# Patient Record
Sex: Female | Born: 1955
Health system: Southern US, Community
[De-identification: ages and names within clinical notes are randomized; demographics above are authoritative.]

## PROBLEM LIST (undated history)

## (undated) DIAGNOSIS — I10 Essential (primary) hypertension: Secondary | ICD-10-CM

## (undated) DIAGNOSIS — E669 Obesity, unspecified: Secondary | ICD-10-CM

## (undated) DIAGNOSIS — R002 Palpitations: Secondary | ICD-10-CM

## (undated) DIAGNOSIS — Z9889 Other specified postprocedural states: Secondary | ICD-10-CM

## (undated) DIAGNOSIS — F32A Depression, unspecified: Secondary | ICD-10-CM

## (undated) DIAGNOSIS — M1990 Unspecified osteoarthritis, unspecified site: Secondary | ICD-10-CM

## (undated) DIAGNOSIS — I499 Cardiac arrhythmia, unspecified: Secondary | ICD-10-CM

## (undated) DIAGNOSIS — G5761 Lesion of plantar nerve, right lower limb: Secondary | ICD-10-CM

## (undated) DIAGNOSIS — K5730 Diverticulosis of large intestine without perforation or abscess without bleeding: Secondary | ICD-10-CM

## (undated) DIAGNOSIS — K219 Gastro-esophageal reflux disease without esophagitis: Secondary | ICD-10-CM

## (undated) DIAGNOSIS — F3289 Other specified depressive episodes: Secondary | ICD-10-CM

## (undated) DIAGNOSIS — F329 Major depressive disorder, single episode, unspecified: Secondary | ICD-10-CM

## (undated) DIAGNOSIS — R9389 Abnormal findings on diagnostic imaging of other specified body structures: Secondary | ICD-10-CM

## (undated) DIAGNOSIS — D1399 Benign neoplasm of ill-defined sites within the digestive system: Secondary | ICD-10-CM

## (undated) DIAGNOSIS — K222 Esophageal obstruction: Secondary | ICD-10-CM

## (undated) DIAGNOSIS — D649 Anemia, unspecified: Secondary | ICD-10-CM

## (undated) DIAGNOSIS — N920 Excessive and frequent menstruation with regular cycle: Secondary | ICD-10-CM

## (undated) DIAGNOSIS — D139 Benign neoplasm of ill-defined sites within the digestive system: Secondary | ICD-10-CM

## (undated) DIAGNOSIS — K8020 Calculus of gallbladder without cholecystitis without obstruction: Secondary | ICD-10-CM

## (undated) HISTORY — DX: Diverticulosis of large intestine without perforation or abscess without bleeding: K57.30

## (undated) HISTORY — DX: Essential (primary) hypertension: I10

## (undated) HISTORY — DX: Esophageal obstruction: K22.2

## (undated) HISTORY — PX: OTHER SURGICAL HISTORY: SHX169

## (undated) HISTORY — DX: Other specified depressive episodes: F32.89

## (undated) HISTORY — PX: COLONOSCOPY: SHX174

## (undated) HISTORY — PX: KNEE SURGERY: SHX244

## (undated) HISTORY — DX: Calculus of gallbladder without cholecystitis without obstruction: K80.20

## (undated) HISTORY — DX: Major depressive disorder, single episode, unspecified: F32.9

## (undated) HISTORY — DX: Gastro-esophageal reflux disease without esophagitis: K21.9

## (undated) HISTORY — DX: Benign neoplasm of ill-defined sites within the digestive system: D13.9

## (undated) HISTORY — DX: Benign neoplasm of ill-defined sites within the digestive system: D13.99

## (undated) HISTORY — DX: Depression, unspecified: F32.A

## (undated) HISTORY — DX: Palpitations: R00.2

## (undated) HISTORY — DX: Anemia, unspecified: D64.9

## (undated) HISTORY — DX: Obesity, unspecified: E66.9

## (undated) HISTORY — PX: UPPER GASTROINTESTINAL ENDOSCOPY: SHX188

## (undated) HISTORY — DX: Unspecified osteoarthritis, unspecified site: M19.90

---
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
## 1998-11-03 ENCOUNTER — Other Ambulatory Visit: Admission: RE | Admit: 1998-11-03 | Discharge: 1998-11-03 | Payer: Self-pay | Admitting: Obstetrics and Gynecology

## 1998-11-11 ENCOUNTER — Ambulatory Visit (HOSPITAL_COMMUNITY): Admission: RE | Admit: 1998-11-11 | Discharge: 1998-11-11 | Payer: Self-pay | Admitting: Obstetrics and Gynecology

## 1998-11-11 ENCOUNTER — Encounter: Payer: Self-pay | Admitting: Obstetrics and Gynecology

## 1998-11-16 ENCOUNTER — Ambulatory Visit (HOSPITAL_COMMUNITY): Admission: RE | Admit: 1998-11-16 | Discharge: 1998-11-16 | Payer: Self-pay | Admitting: Obstetrics and Gynecology

## 1998-11-16 ENCOUNTER — Encounter: Payer: Self-pay | Admitting: Obstetrics and Gynecology

## 1999-11-12 ENCOUNTER — Other Ambulatory Visit: Admission: RE | Admit: 1999-11-12 | Discharge: 1999-11-12 | Payer: Self-pay | Admitting: *Deleted

## 1999-11-26 ENCOUNTER — Encounter: Payer: Self-pay | Admitting: Obstetrics and Gynecology

## 1999-11-26 ENCOUNTER — Ambulatory Visit (HOSPITAL_COMMUNITY): Admission: RE | Admit: 1999-11-26 | Discharge: 1999-11-26 | Payer: Self-pay | Admitting: *Deleted

## 2000-11-15 ENCOUNTER — Other Ambulatory Visit: Admission: RE | Admit: 2000-11-15 | Discharge: 2000-11-15 | Payer: Self-pay | Admitting: Obstetrics and Gynecology

## 2000-11-29 ENCOUNTER — Encounter: Payer: Self-pay | Admitting: Obstetrics and Gynecology

## 2000-11-29 ENCOUNTER — Ambulatory Visit (HOSPITAL_COMMUNITY): Admission: RE | Admit: 2000-11-29 | Discharge: 2000-11-29 | Payer: Self-pay | Admitting: Obstetrics and Gynecology

## 2002-01-01 ENCOUNTER — Ambulatory Visit (HOSPITAL_COMMUNITY): Admission: RE | Admit: 2002-01-01 | Discharge: 2002-01-01 | Payer: Self-pay | Admitting: Obstetrics and Gynecology

## 2002-01-01 ENCOUNTER — Encounter: Payer: Self-pay | Admitting: Obstetrics and Gynecology

## 2002-01-23 ENCOUNTER — Other Ambulatory Visit: Admission: RE | Admit: 2002-01-23 | Discharge: 2002-01-23 | Payer: Self-pay | Admitting: Obstetrics and Gynecology

## 2003-02-04 ENCOUNTER — Ambulatory Visit (HOSPITAL_COMMUNITY): Admission: RE | Admit: 2003-02-04 | Discharge: 2003-02-04 | Payer: Self-pay | Admitting: Orthopedic Surgery

## 2003-02-19 ENCOUNTER — Ambulatory Visit (HOSPITAL_COMMUNITY): Admission: RE | Admit: 2003-02-19 | Discharge: 2003-02-19 | Payer: Self-pay | Admitting: Obstetrics and Gynecology

## 2003-02-26 ENCOUNTER — Other Ambulatory Visit: Admission: RE | Admit: 2003-02-26 | Discharge: 2003-02-26 | Payer: Self-pay | Admitting: Obstetrics and Gynecology

## 2004-03-19 ENCOUNTER — Ambulatory Visit (HOSPITAL_COMMUNITY): Admission: RE | Admit: 2004-03-19 | Discharge: 2004-03-19 | Payer: Self-pay | Admitting: Obstetrics and Gynecology

## 2005-03-23 ENCOUNTER — Ambulatory Visit (HOSPITAL_COMMUNITY): Admission: RE | Admit: 2005-03-23 | Discharge: 2005-03-23 | Payer: Self-pay | Admitting: Obstetrics and Gynecology

## 2006-04-05 ENCOUNTER — Ambulatory Visit (HOSPITAL_COMMUNITY): Admission: RE | Admit: 2006-04-05 | Discharge: 2006-04-05 | Payer: Self-pay | Admitting: Obstetrics and Gynecology

## 2006-11-04 ENCOUNTER — Emergency Department (HOSPITAL_COMMUNITY): Admission: EM | Admit: 2006-11-04 | Discharge: 2006-11-04 | Payer: Self-pay | Admitting: Emergency Medicine

## 2006-11-13 ENCOUNTER — Ambulatory Visit: Payer: Self-pay | Admitting: Gastroenterology

## 2006-12-05 ENCOUNTER — Encounter: Payer: Self-pay | Admitting: Gastroenterology

## 2006-12-05 ENCOUNTER — Ambulatory Visit: Payer: Self-pay | Admitting: Gastroenterology

## 2006-12-05 DIAGNOSIS — K222 Esophageal obstruction: Secondary | ICD-10-CM | POA: Insufficient documentation

## 2006-12-05 DIAGNOSIS — D139 Benign neoplasm of ill-defined sites within the digestive system: Secondary | ICD-10-CM | POA: Insufficient documentation

## 2006-12-28 ENCOUNTER — Ambulatory Visit: Payer: Self-pay | Admitting: Gastroenterology

## 2006-12-28 DIAGNOSIS — K5730 Diverticulosis of large intestine without perforation or abscess without bleeding: Secondary | ICD-10-CM | POA: Insufficient documentation

## 2007-04-24 DIAGNOSIS — F329 Major depressive disorder, single episode, unspecified: Secondary | ICD-10-CM | POA: Insufficient documentation

## 2007-09-28 ENCOUNTER — Ambulatory Visit (HOSPITAL_COMMUNITY): Admission: RE | Admit: 2007-09-28 | Discharge: 2007-09-28 | Payer: Self-pay | Admitting: Obstetrics and Gynecology

## 2009-10-28 ENCOUNTER — Ambulatory Visit (HOSPITAL_COMMUNITY): Admission: RE | Admit: 2009-10-28 | Discharge: 2009-10-28 | Payer: Self-pay | Admitting: Obstetrics and Gynecology

## 2009-11-02 ENCOUNTER — Encounter: Admission: RE | Admit: 2009-11-02 | Discharge: 2009-11-02 | Payer: Self-pay | Admitting: Obstetrics and Gynecology

## 2010-03-07 ENCOUNTER — Encounter: Payer: Self-pay | Admitting: Obstetrics and Gynecology

## 2010-06-29 NOTE — Assessment & Plan Note (Signed)
[facility]                         GASTROENTEROLOGY OFFICE NOTE   NAME:Victoria Beasley, Victoria Beasley                     MRN:          [id number]  DATE:11/13/2006                            DOB:          [birth year]    REASON FOR CONSULTATION:  1. Colorectal cancer screening.  2. Dysphagia.   [name] is a pleasant 55 year old white female here for evaluation  of above. On occasion she has seen bright red blood on the toilet paper.  She has had a history of hemorrhoids. She denies change in bowel habits.  Her main GI complaint is an episode of a pill getting stuck in the back  of her throat. She was seen in the ER for this. It passed spontaneously.  No foreign body was seen by the ER physician. Ms. Salts does report  mild dysphagia to solids. She has longstanding pyrosis which is well-  controlled with omeprazole. She denies odynophagia.   PAST MEDICAL HISTORY:  Pertinent for depression.   PAST SURGICAL HISTORY:  She is status post C-section x2 and arthroscopic  surgery for her knee.   FAMILY HISTORY:  Pertinent for a brother with heart disease and a sister  and mother with diabetes.   CURRENT MEDICATIONS:  1. Omeprazole.  2. Wellbutrin.  3. Celebrex.   ALLERGIES:  No known drug allergies.   SOCIAL HISTORY:  She does not smoke. She drinks rarely. 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   REVIEW OF SYSTEMS:  Is positive for some leakage of urine.   PHYSICAL EXAMINATION:  Pulse 76. Blood pressure 126/82, weight 243.  HEENT: EOMI.  PERRLA.  Sclerae are anicteric.  Conjunctivae are pink.  NECK:  Supple without thyromegaly, adenopathy or carotid bruits.  CHEST:  Clear to auscultation and percussion without adventitious  sounds.  CARDIAC:  Regular rhythm; normal S1 S2.  There are no murmurs, gallops  or rubs.  ABDOMEN:  Bowel sounds are normoactive.  Abdomen is soft, nontender and  nondistended.  There are no abdominal masses,  tenderness, splenic  enlargement or hepatomegaly.  EXTREMITIES:  Full range of motion.  No cyanosis, clubbing or edema.  RECTAL:  Deferred.   IMPRESSION:  1. Limited rectal bleeding, most likely secondary to hemorrhoids.  2. Dysphagia, rule out early esophageal stricture.  3. Gastroesophageal reflux disease.   RECOMMENDATION:  1. Screening colonoscopy.  2. Upper endoscopy with dilation as indicated.  3. Continue omeprazole.     Barbette Hair. Arlyce Dice, MD,FACG  Electronically Signed    RDK/MedQ  DD: 11/13/2006  DT: 11/13/2006  Job #: [id number]   cc:   Gretta Arab. Valentina Lucks, M.D.

## 2010-06-29 NOTE — Letter (Signed)
November 13, 2006    Nolene Bernheim   RE:  CHARLINE, HOSKINSON  MRN:  [id number]  /  DOB:  [birth year]   Dear Ms. Math:   It is my pleasure to have treated you recently as a new patient in my  office.  I appreciate your confidence and the opportunity to participate  in your care.   Since I do have a busy inpatient endoscopy schedule and office schedule,  my office hours vary weekly.  I am, however, available for emergency  calls every day through my office.  If I cannot promptly meet an urgent  office appointment, another one of our gastroenterologists will be able  to assist you.   My well-trained staff are prepared to help you at all times.  For  emergencies after office hours, a physician from our gastroenterology  section is always available through my 24-hour answering service.   While you are under my care, I encourage discussion of your questions  and concerns, and I will be happy to return your calls as soon as I am  available.   Once again, I welcome you as a new patient and I look forward to a happy  and healthy relationship.    Sincerely,      Barbette Hair. Arlyce Dice, MD,FACG  Electronically Signed   RDK/MedQ  DD: 11/13/2006  DT: 11/13/2006  Job #: [id number]

## 2010-07-22 ENCOUNTER — Encounter: Payer: Self-pay | Admitting: Gastroenterology

## 2010-07-22 ENCOUNTER — Ambulatory Visit (INDEPENDENT_AMBULATORY_CARE_PROVIDER_SITE_OTHER): Payer: 59 | Admitting: Gastroenterology

## 2010-07-22 DIAGNOSIS — K8020 Calculus of gallbladder without cholecystitis without obstruction: Secondary | ICD-10-CM | POA: Insufficient documentation

## 2010-07-22 DIAGNOSIS — K219 Gastro-esophageal reflux disease without esophagitis: Secondary | ICD-10-CM

## 2010-07-22 MED ORDER — DEXLANSOPRAZOLE 60 MG PO CPDR: DELAYED_RELEASE_CAPSULE | ORAL | Status: DC

## 2010-07-22 NOTE — Assessment & Plan Note (Signed)
She has had persistent symptoms despite taking Nexium.  Recommendations #1 trial of dexilant to 60 mg twice a day #2 if not improved in the next 7-10 days I will repeat her upper endoscopy

## 2010-07-22 NOTE — Patient Instructions (Signed)
Dexilant samples Follow up in 6 weeks Call back in 1 week if no better  Acid Reflux (GERD) Acid reflux is also called gastroesophageal reflux disease (GERD). Your stomach makes acid to help digest food. Acid reflux happens when acid from your stomach goes into the tube between your mouth and stomach (esophagus). Your stomach is protected from the acid, but this tube is not. When acid gets into the tube, it may cause a burning feeling in the chest (heartburn). Besides heartburn, other health problems can happen if the acid keeps going into the tube. Some causes of acid reflux include:  Being overweight.   Smoking.   Drinking alcohol.   Eating large meals.   Eating meals and then going to bed right away.   Eating certain foods.   Increased stomach acid production.  HOME CARE  Take all medicine as told by your doctor.   You may need to:   Lose weight.   Avoid alcohol.   Quit smoking.   Do not eat big meals. It is better to eat smaller meals throughout the day.   Do not eat a meal and then nap or go to bed.   Sleep with your head higher than your stomach.   Avoid foods that bother you.   You may need more tests, or you may need to see a special doctor.  GET HELP RIGHT AWAY IF:  You have chest pain that is different than before.   You have pain that goes to your arms, jaw, or between your shoulder blades.   You throw up (vomit) blood, dark brown liquid, or your throw up looks like coffee grounds.   You have trouble swallowing.   You have trouble breathing or cannot stop coughing.   You feel dizzy or pass out.   Your skin is cool, wet, and pale.   Your medicine is not helping.  MAKE SURE YOU:   Understand these instructions.   Will watch your condition.   Will get help right away if you are not doing well or get worse.  Document Released: 07/20/2007 Document Re-Released: 04/27/2009 [facility] Patient Information 2011 [place], [place].

## 2010-07-22 NOTE — Progress Notes (Signed)
History of Present Illness:  Victoria Beasley is a pleasant 55 year old white female with history of esophageal stricture and diverticulosis referred at the request of Dr. Valentina Lucks for evaluation of chest discomfort. Endoscopy in 2008 demonstrated a distal esophageal stricture which was dilated. Benign gastric polyps were seen. Colonoscopy in November, 2008 showed diverticulosis.  Following a viral infection characterized by fever and muscle aches in April, 2012 she  developed severe burning chest discomfort. This may occur throughout the day and at night despite taking Nexium. She has had occasional odynophagia but denies dysphagia. She takes Celebrex daily which has been her routine for years.  Abdominal ultrasound in April, 2012 demonstrated gallstones. No abnormalities of the gallbladder wall were noted.   Review of Systems: Pertinent positive and negative review of systems were noted in the above HPI section. All other review of systems were otherwise negative.    Current Medications, Allergies, Past Medical History, Past Surgical History, Family History and Social History were reviewed in [facility] electronic medical record  Vital signs were reviewed in today's medical record. Physical Exam: General: Well developed , well nourished, no acute distress Head: Normocephalic and atraumatic Eyes:  sclerae anicteric, EOMI Ears: Normal auditory acuity Mouth: No deformity or lesions Lungs: Clear throughout to auscultation Heart: Regular rate and rhythm; no murmurs, rubs or bruits Abdomen: Soft, non tender and non distended. No masses, hepatosplenomegaly or hernias noted. Normal Bowel sounds Rectal:deferred Musculoskeletal: Symmetrical with no gross deformities  Pulses:  Normal pulses noted Extremities: No clubbing, cyanosis, edema or deformities noted Neurological: Alert oriented x 4, grossly nonfocal Psychological:  Alert and cooperative. Normal mood and affect

## 2010-07-22 NOTE — Assessment & Plan Note (Signed)
This is an incidental finding. Recommend no further GI workup or therapy.

## 2010-07-29 ENCOUNTER — Telehealth: Payer: Self-pay | Admitting: Gastroenterology

## 2010-07-29 NOTE — Telephone Encounter (Signed)
Schedule her for EGD

## 2010-07-29 NOTE — Telephone Encounter (Signed)
Pt states that she is taking Dexilant twice daily and the burning that she was having is slightly better. She states that she still feels like there is something in her throat though. Pt states she was told to call back if the med was not working well. Dr. Arlyce Dice please advise.

## 2010-07-30 NOTE — Telephone Encounter (Signed)
Pt scheduled for previsit for 08/02/10@4 :30pm, EGD scheduled for 08/12/10@10am  in the [facility]. Pt aware of appt dates and times.

## 2010-08-02 ENCOUNTER — Ambulatory Visit (AMBULATORY_SURGERY_CENTER): Payer: 59 | Admitting: *Deleted

## 2010-08-02 VITALS — Ht 65.0 in | Wt 231.6 lb

## 2010-08-02 DIAGNOSIS — K219 Gastro-esophageal reflux disease without esophagitis: Secondary | ICD-10-CM

## 2010-08-03 ENCOUNTER — Encounter: Payer: Self-pay | Admitting: Gastroenterology

## 2010-08-12 ENCOUNTER — Ambulatory Visit (AMBULATORY_SURGERY_CENTER): Payer: 59 | Admitting: Gastroenterology

## 2010-08-12 ENCOUNTER — Encounter: Payer: Self-pay | Admitting: Gastroenterology

## 2010-08-12 VITALS — BP 125/61 | HR 78 | Temp 100.7°F | Resp 14 | Ht 65.0 in | Wt 226.0 lb

## 2010-08-12 DIAGNOSIS — R079 Chest pain, unspecified: Secondary | ICD-10-CM

## 2010-08-12 DIAGNOSIS — D131 Benign neoplasm of stomach: Secondary | ICD-10-CM

## 2010-08-12 DIAGNOSIS — K219 Gastro-esophageal reflux disease without esophagitis: Secondary | ICD-10-CM

## 2010-08-12 MED ORDER — SODIUM CHLORIDE 0.9 % IV SOLN: 500.0000 mL | INTRAVENOUS | Status: DC

## 2010-08-12 NOTE — Patient Instructions (Addendum)
Hold celebrex for 5-7 days  Continue PPI Dexilant 1-2 times a day  Avoid aspirin containing products  Call office next 2-3 days to schedule an office appointment for 1 month

## 2010-08-13 ENCOUNTER — Telehealth: Payer: Self-pay | Admitting: *Deleted

## 2010-08-13 NOTE — Telephone Encounter (Signed)
No answer, message left for patient. 

## 2010-09-14 ENCOUNTER — Encounter: Payer: Self-pay | Admitting: Gastroenterology

## 2010-09-14 ENCOUNTER — Ambulatory Visit (INDEPENDENT_AMBULATORY_CARE_PROVIDER_SITE_OTHER): Payer: 59 | Admitting: Gastroenterology

## 2010-09-14 VITALS — BP 124/72 | HR 72 | Ht 65.0 in | Wt 229.6 lb

## 2010-09-14 DIAGNOSIS — K219 Gastro-esophageal reflux disease without esophagitis: Secondary | ICD-10-CM

## 2010-09-14 MED ORDER — ESOMEPRAZOLE MAGNESIUM 40 MG PO CPDR: 40.0000 mg | DELAYED_RELEASE_CAPSULE | Freq: Every day | ORAL | Status: AC

## 2010-09-14 NOTE — Progress Notes (Signed)
History of Present Illness:  Mrs. Huie has returned following upper endoscopy. Exam demonstrated benign gastric-appearing polyps. Symptoms of reflux have subsided after discontinuing Celebrex. She is taking Nexium again and feels well. Her main complaint is arthritic pain.    Review of Systems: Pertinent positive and negative review of systems were noted in the above HPI section. All other review of systems were otherwise negative.    Current Medications, Allergies, Past Medical History, Past Surgical History, Family History and Social History were reviewed in [facility] electronic medical record  Vital signs were reviewed in today's medical record. Physical Exam: General: Well developed , well nourished, no acute distress

## 2010-09-14 NOTE — Assessment & Plan Note (Addendum)
Symptoms clearly are exacerbated by NSAIDs.  Recommendations #1 continue Nexium #2 the patient was instructed to take NSAIDs carefully provided that she tolerates this medicine.

## 2010-09-14 NOTE — Patient Instructions (Signed)
Your Nexium is being sent to [facility] for you today Follow up as needed

## 2010-12-07 ENCOUNTER — Other Ambulatory Visit (HOSPITAL_COMMUNITY): Payer: Self-pay | Admitting: Obstetrics & Gynecology

## 2010-12-07 DIAGNOSIS — Z1231 Encounter for screening mammogram for malignant neoplasm of breast: Secondary | ICD-10-CM

## 2010-12-30 ENCOUNTER — Ambulatory Visit (HOSPITAL_COMMUNITY): Payer: 59

## 2011-01-07 ENCOUNTER — Ambulatory Visit (HOSPITAL_COMMUNITY)
Admission: RE | Admit: 2011-01-07 | Discharge: 2011-01-07 | Disposition: A | Discharge status: Home / Self Care | Payer: 59 | Source: Ambulatory Visit | Attending: Obstetrics & Gynecology | Admitting: Obstetrics & Gynecology

## 2011-01-07 DIAGNOSIS — Z1231 Encounter for screening mammogram for malignant neoplasm of breast: Secondary | ICD-10-CM

## 2011-07-26 ENCOUNTER — Encounter (INDEPENDENT_AMBULATORY_CARE_PROVIDER_SITE_OTHER): Payer: Self-pay | Admitting: General Surgery

## 2011-07-26 ENCOUNTER — Ambulatory Visit (INDEPENDENT_AMBULATORY_CARE_PROVIDER_SITE_OTHER): Payer: 59 | Admitting: General Surgery

## 2011-07-26 VITALS — BP 110/84 | HR 88 | Temp 97.5°F | Resp 14 | Ht 65.0 in | Wt 234.2 lb

## 2011-07-26 DIAGNOSIS — K8020 Calculus of gallbladder without cholecystitis without obstruction: Secondary | ICD-10-CM

## 2011-07-26 DIAGNOSIS — K8050 Calculus of bile duct without cholangitis or cholecystitis without obstruction: Secondary | ICD-10-CM

## 2011-07-26 NOTE — Progress Notes (Signed)
Patient ID: Victoria Beasley, female   DOB: [birth year], 56 y.o.   MRN: [id number]  Chief Complaint  Patient presents with  . Re-evaluation    Est. Pt. Gallstones    HPI Victoria Beasley is a 56 y.o. female.   HPI This is a 84 her old female with a history of gastroesophageal reflux disease who I initially saw in May of 2012. She has some issues and was seen by Dr. Arlyce Dice at that point and all of these are much better now. Recently over the last for 5 months she has had 2 fairly significant episodes of right upper quadrant pain and nausea. She was then referred back for evaluation for discussion of a cholecystectomy. Her AST was elevated at 185 and her ALT was elevated at 83 on her last visit with a normal white blood cell count. She has an ultrasound in 2012 that shows fairly extensive cholelithiasis. She comes back in today complaining of these episodes after eating fatty foods of some right upper quadrant pain radiating to her back associated with nausea.  Past Medical History  Diagnosis Date  . Diverticulosis of colon (without mention of hemorrhage)   . Stricture and stenosis of esophagus   . Benign neoplasm of other and unspecified site of the digestive system   . Depressive disorder, not elsewhere classified   . Arthritis   . GERD (gastroesophageal reflux disease)   . Gallstones   . Obesity   . Hypertension   . Esophageal stricture   . Anemia   . Heart palpitations     Past Surgical History  Procedure Date  . Cesarean section     x 2  . Knee surgery     left  . Vericose vein laser     x 3  . Colonoscopy   . Upper gastrointestinal endoscopy   . Foot neuroma surgery     right    Family History  Problem Relation Age of Onset  . Colon cancer Neg Hx   . Esophageal cancer Cousin 65  . Diabetes Sister   . Diabetes Mother     Social History History  Substance Use Topics  . Smoking status: Never Smoker   . Smokeless tobacco: Never Used  . Alcohol Use: 1.8 oz/week   3 Glasses of wine per week    No Known Allergies  Current Outpatient Prescriptions  Medication Sig Dispense Refill  . cholecalciferol (VITAMIN D) 1000 UNITS tablet Take 1,000 Units by mouth daily.        Marland Kitchen esomeprazole (NEXIUM) 40 MG capsule Take 1 capsule (40 mg total) by mouth daily.  90 capsule  4  . hydrochlorothiazide (,MICROZIDE/HYDRODIURIL,) 12.5 MG capsule Take 12.5 mg by mouth daily.        . Multiple Vitamin (MULTIVITAMIN PO) Take by mouth.        Marland Kitchen NEXIUM 40 MG capsule       . NON FORMULARY Omega Joint Extra 3 capsules daily         Review of Systems Review of Systems  Constitutional: Negative for fever, chills and unexpected weight change.  HENT: Negative for hearing loss, congestion, sore throat, trouble swallowing and voice change.   Eyes: Negative for visual disturbance.  Respiratory: Negative for cough and wheezing.   Cardiovascular: Negative for chest pain, palpitations and leg swelling.  Gastrointestinal: Positive for abdominal pain. Negative for nausea, vomiting, diarrhea, constipation, blood in stool, abdominal distention and anal bleeding.  Genitourinary: Negative for hematuria, vaginal bleeding and  difficulty urinating.  Musculoskeletal: Negative for arthralgias.  Skin: Negative for rash and wound.  Neurological: Negative for seizures, syncope and headaches.  Hematological: Negative for adenopathy. Does not bruise/bleed easily.  Psychiatric/Behavioral: Negative for confusion.    Blood pressure 110/84, pulse 88, temperature 97.5 F (36.4 C), temperature source Temporal, resp. rate 14, height 5\' 5"  (1.651 m), weight 234 lb 3.2 oz (106.232 kg).  Physical Exam Physical Exam  Vitals reviewed. Constitutional: She appears well-developed and well-nourished.  Eyes: No scleral icterus.  Cardiovascular: Normal rate, regular rhythm and normal heart sounds.   Pulmonary/Chest: Effort normal and breath sounds normal. She has no wheezes. She has no rales.  Abdominal:  Soft. Bowel sounds are normal. She exhibits no distension. There is no tenderness.    Data Reviewed Prior u/s and Dr. Nita Sells notes  Assessment    Symptomatic cholelithiasis    Plan   I think pretty clearly her symptoms are referable to her gallbladder I discussed the procedure in detail.  The patient was given Agricultural engineer.  We discussed the risks and benefits of a laparoscopic cholecystectomy and possible cholangiogram including, but not limited to bleeding, infection, injury to surrounding structures such as the intestine or liver, bile leak, retained gallstones, need to convert to an open procedure, prolonged diarrhea, blood clots such as  DVT, common bile duct injury, anesthesia risks, and possible need for additional procedures.  The likelihood of improvement in symptoms and return to the patient's normal status is good. We discussed the typical post-operative recovery course.        Victoria Beasley 07/26/2011, 3:58 PM

## 2011-08-19 ENCOUNTER — Encounter (HOSPITAL_COMMUNITY): Payer: Self-pay | Admitting: Pharmacy Technician

## 2011-09-01 ENCOUNTER — Encounter (HOSPITAL_COMMUNITY): Payer: Self-pay

## 2011-09-01 ENCOUNTER — Encounter (HOSPITAL_COMMUNITY)
Admission: RE | Admit: 2011-09-01 | Discharge: 2011-09-01 | Disposition: A | Discharge status: Home / Self Care | Payer: 59 | Source: Ambulatory Visit | Attending: General Surgery | Admitting: General Surgery

## 2011-09-01 LAB — CBC
Hemoglobin: 11.5 g/dL — ABNORMAL LOW (ref 12.0–15.0)
MCH: 28.3 pg (ref 26.0–34.0)
Platelets: 254 10*3/uL (ref 150–400)
RBC: 4.07 MIL/uL (ref 3.87–5.11)

## 2011-09-01 LAB — BASIC METABOLIC PANEL
Calcium: 9.5 mg/dL (ref 8.4–10.5)
GFR calc Af Amer: >90 mL/min (ref >90)
GFR calc non Af Amer: >90 mL/min (ref >90)
Potassium: 3.9 mEq/L (ref 3.5–5.1)
Sodium: 140 mEq/L (ref 135–145)

## 2011-09-01 LAB — SURGICAL PCR SCREEN
MRSA, PCR: NEGATIVE
Staphylococcus aureus: NEGATIVE

## 2011-09-01 NOTE — Pre-Procedure Instructions (Signed)
20 Victoria Beasley  09/01/2011   Your procedure is scheduled on:  09/06/11  Report to [facility] at 1130 AM.  Call this number if you have problems the morning of surgery: [phone]   Remember:   Do not eat food:After Midnight.  May have clear liquids:until Midnight .  Clear liquids include soda, tea, black coffee, apple or grape juice, broth.  Take these medicines the morning of surgery with A SIP OF WATER: nexium   Do not wear jewelry, make-up or nail polish.  Do not wear lotions, powders, or perfumes. You may wear deodorant.  Do not shave 48 hours prior to surgery. Men may shave face and neck.  Do not bring valuables to the hospital.  Contacts, dentures or bridgework may not be worn into surgery.  Leave suitcase in the car. After surgery it may be brought to your room.  For patients admitted to the hospital, checkout time is 11:00 AM the day of discharge.   Patients discharged the day of surgery will not be allowed to drive home.  Name and phone number of your driver: family  Special Instructions: CHG Shower Use Special Wash: 1/2 bottle night before surgery and 1/2 bottle morning of surgery.   Please read over the following fact sheets that you were given: Pain Booklet, Coughing and Deep Breathing, MRSA Information and Surgical Site Infection Prevention

## 2011-09-05 MED ORDER — DEXTROSE 5 % IV SOLN
2.0000 g | INTRAVENOUS | Status: AC
  Administered 2011-09-06: 2 g via INTRAVENOUS
  Filled 2011-09-05: qty 2

## 2011-09-06 ENCOUNTER — Ambulatory Visit (HOSPITAL_COMMUNITY)
Admission: RE | Admit: 2011-09-06 | Discharge: 2011-09-06 | Disposition: A | Discharge status: Home / Self Care | Payer: 59 | Source: Ambulatory Visit | Attending: General Surgery | Admitting: General Surgery

## 2011-09-06 ENCOUNTER — Ambulatory Visit (HOSPITAL_COMMUNITY): Payer: 59 | Admitting: Anesthesiology

## 2011-09-06 ENCOUNTER — Encounter (HOSPITAL_COMMUNITY): Payer: Self-pay | Admitting: Anesthesiology

## 2011-09-06 ENCOUNTER — Encounter (HOSPITAL_COMMUNITY)
Admission: RE | Disposition: A | Discharge status: Home / Self Care | Payer: Self-pay | Source: Ambulatory Visit | Attending: General Surgery

## 2011-09-06 DIAGNOSIS — K824 Cholesterolosis of gallbladder: Secondary | ICD-10-CM

## 2011-09-06 DIAGNOSIS — K811 Chronic cholecystitis: Secondary | ICD-10-CM

## 2011-09-06 DIAGNOSIS — E669 Obesity, unspecified: Secondary | ICD-10-CM | POA: Insufficient documentation

## 2011-09-06 DIAGNOSIS — K219 Gastro-esophageal reflux disease without esophagitis: Secondary | ICD-10-CM | POA: Insufficient documentation

## 2011-09-06 DIAGNOSIS — Z01812 Encounter for preprocedural laboratory examination: Secondary | ICD-10-CM | POA: Insufficient documentation

## 2011-09-06 DIAGNOSIS — I10 Essential (primary) hypertension: Secondary | ICD-10-CM | POA: Insufficient documentation

## 2011-09-06 DIAGNOSIS — Z01818 Encounter for other preprocedural examination: Secondary | ICD-10-CM | POA: Insufficient documentation

## 2011-09-06 DIAGNOSIS — Z01810 Encounter for preprocedural cardiovascular examination: Secondary | ICD-10-CM | POA: Insufficient documentation

## 2011-09-06 HISTORY — PX: CHOLECYSTECTOMY: SHX55

## 2011-09-06 SURGERY — LAPAROSCOPIC CHOLECYSTECTOMY
Anesthesia: General | Site: Abdomen | Wound class: Contaminated

## 2011-09-06 MED ORDER — DEXTROSE 5 % IV SOLN
INTRAVENOUS | Status: DC | PRN
  Administered 2011-09-06: 13:00:00 via INTRAVENOUS

## 2011-09-06 MED ORDER — SODIUM CHLORIDE 0.9 % IR SOLN
Status: DC | PRN
  Administered 2011-09-06: 1000 mL

## 2011-09-06 MED ORDER — ONDANSETRON HCL 4 MG/2ML IJ SOLN
4.0000 mg | Freq: Four times a day (QID) | INTRAMUSCULAR | Status: DC | PRN
  Administered 2011-09-06: 4 mg via INTRAVENOUS

## 2011-09-06 MED ORDER — LACTATED RINGERS IV SOLN
INTRAVENOUS | Status: DC | PRN
  Administered 2011-09-06 (×2): via INTRAVENOUS

## 2011-09-06 MED ORDER — PROPOFOL 10 MG/ML IV EMUL
INTRAVENOUS | Status: DC | PRN
  Administered 2011-09-06: 200 mg via INTRAVENOUS

## 2011-09-06 MED ORDER — ONDANSETRON HCL 4 MG/2ML IJ SOLN
INTRAMUSCULAR | Status: DC | PRN
  Administered 2011-09-06: 4 mg via INTRAVENOUS

## 2011-09-06 MED ORDER — MIDAZOLAM HCL 5 MG/5ML IJ SOLN
INTRAMUSCULAR | Status: DC | PRN
  Administered 2011-09-06: 2 mg via INTRAVENOUS

## 2011-09-06 MED ORDER — OXYCODONE-ACETAMINOPHEN 5-325 MG PO TABS: 1.0000 | ORAL_TABLET | ORAL | Status: AC | PRN

## 2011-09-06 MED ORDER — SODIUM CHLORIDE 0.9 % IR SOLN: Status: DC | PRN

## 2011-09-06 MED ORDER — ASPIRIN 81 MG PO CHEW
CHEWABLE_TABLET | ORAL | Status: AC
  Filled 2011-09-06: qty 1

## 2011-09-06 MED ORDER — SODIUM CHLORIDE 0.9 % IJ SOLN: 3.0000 mL | Freq: Two times a day (BID) | INTRAMUSCULAR | Status: DC

## 2011-09-06 MED ORDER — HYDROMORPHONE HCL PF 1 MG/ML IJ SOLN
INTRAMUSCULAR | Status: AC
  Filled 2011-09-06: qty 1

## 2011-09-06 MED ORDER — ROCURONIUM BROMIDE 100 MG/10ML IV SOLN
INTRAVENOUS | Status: DC | PRN
  Administered 2011-09-06: 30 mg via INTRAVENOUS

## 2011-09-06 MED ORDER — GLYCOPYRROLATE 0.2 MG/ML IJ SOLN
INTRAMUSCULAR | Status: DC | PRN
  Administered 2011-09-06: .5 mg via INTRAVENOUS

## 2011-09-06 MED ORDER — KETOROLAC TROMETHAMINE 30 MG/ML IJ SOLN
INTRAMUSCULAR | Status: DC | PRN
  Administered 2011-09-06: 30 mg via INTRAVENOUS

## 2011-09-06 MED ORDER — METOCLOPRAMIDE HCL 5 MG/ML IJ SOLN
INTRAMUSCULAR | Status: AC
  Filled 2011-09-06: qty 2

## 2011-09-06 MED ORDER — NEOSTIGMINE METHYLSULFATE 1 MG/ML IJ SOLN
INTRAMUSCULAR | Status: DC | PRN
  Administered 2011-09-06: 4 mg via INTRAVENOUS

## 2011-09-06 MED ORDER — DROPERIDOL 2.5 MG/ML IJ SOLN: 0.6250 mg | INTRAMUSCULAR | Status: DC | PRN

## 2011-09-06 MED ORDER — ACETAMINOPHEN 650 MG RE SUPP
650.0000 mg | RECTAL | Status: DC | PRN
  Administered 2011-09-06: 650 mg via RECTAL

## 2011-09-06 MED ORDER — HYDROMORPHONE HCL PF 1 MG/ML IJ SOLN
0.2500 mg | INTRAMUSCULAR | Status: DC | PRN
  Administered 2011-09-06 (×2): 0.5 mg via INTRAVENOUS

## 2011-09-06 MED ORDER — SUCCINYLCHOLINE CHLORIDE 20 MG/ML IJ SOLN
INTRAMUSCULAR | Status: DC | PRN
  Administered 2011-09-06: 120 mg via INTRAVENOUS

## 2011-09-06 MED ORDER — SODIUM CHLORIDE 0.9 % IJ SOLN: 3.0000 mL | INTRAMUSCULAR | Status: DC | PRN

## 2011-09-06 MED ORDER — BUPIVACAINE-EPINEPHRINE PF 0.25-1:200000 % IJ SOLN
INTRAMUSCULAR | Status: AC
  Filled 2011-09-06: qty 30

## 2011-09-06 MED ORDER — 0.9 % SODIUM CHLORIDE (POUR BTL) OPTIME
TOPICAL | Status: DC | PRN
  Administered 2011-09-06: 1000 mL

## 2011-09-06 MED ORDER — OXYCODONE HCL 5 MG PO TABS: 5.0000 mg | ORAL_TABLET | ORAL | Status: DC | PRN

## 2011-09-06 MED ORDER — SODIUM CHLORIDE 0.9 % IV SOLN: INTRAVENOUS | Status: DC

## 2011-09-06 MED ORDER — LACTATED RINGERS IV SOLN
INTRAVENOUS | Status: DC
  Administered 2011-09-06: 12:00:00 via INTRAVENOUS

## 2011-09-06 MED ORDER — SODIUM CHLORIDE 0.9 % IV SOLN: 250.0000 mL | INTRAVENOUS | Status: DC | PRN

## 2011-09-06 MED ORDER — BUPIVACAINE-EPINEPHRINE 0.25% -1:200000 IJ SOLN
INTRAMUSCULAR | Status: DC | PRN
  Administered 2011-09-06: 12 mL

## 2011-09-06 MED ORDER — HEMOSTATIC AGENTS (NO CHARGE) OPTIME
TOPICAL | Status: DC | PRN
  Administered 2011-09-06: 1 via TOPICAL

## 2011-09-06 MED ORDER — FENTANYL CITRATE 0.05 MG/ML IJ SOLN
INTRAMUSCULAR | Status: DC | PRN
  Administered 2011-09-06: 50 ug via INTRAVENOUS
  Administered 2011-09-06 (×2): 100 ug via INTRAVENOUS

## 2011-09-06 MED ORDER — SODIUM CHLORIDE 0.9 % IV SOLN
INTRAVENOUS | Status: DC | PRN
  Administered 2011-09-06: 14:00:00

## 2011-09-06 MED ORDER — MORPHINE SULFATE 2 MG/ML IJ SOLN: 2.0000 mg | INTRAMUSCULAR | Status: DC | PRN

## 2011-09-06 MED ORDER — DROPERIDOL 2.5 MG/ML IJ SOLN
INTRAMUSCULAR | Status: AC
  Filled 2011-09-06: qty 2

## 2011-09-06 MED ORDER — LIDOCAINE HCL 4 % MT SOLN
OROMUCOSAL | Status: DC | PRN
  Administered 2011-09-06: 4 mL via TOPICAL

## 2011-09-06 MED ORDER — METOCLOPRAMIDE HCL 5 MG/ML IJ SOLN
10.0000 mg | Freq: Once | INTRAMUSCULAR | Status: AC
  Administered 2011-09-06: 10 mg via INTRAVENOUS

## 2011-09-06 MED ORDER — ACETAMINOPHEN 325 MG PO TABS: 650.0000 mg | ORAL_TABLET | ORAL | Status: DC | PRN

## 2011-09-06 MED ORDER — ONDANSETRON HCL 4 MG/2ML IJ SOLN
INTRAMUSCULAR | Status: AC
  Filled 2011-09-06: qty 2

## 2011-09-06 SURGICAL SUPPLY — 42 items
APPLIER CLIP 5 13 M/L LIGAMAX5 (MISCELLANEOUS) ×3
BLADE SURG ROTATE 9660 (MISCELLANEOUS) IMPLANT
CANISTER SUCTION 2500CC (MISCELLANEOUS) ×3 IMPLANT
CHLORAPREP W/TINT 26ML (MISCELLANEOUS) ×3 IMPLANT
CLIP APPLIE 5 13 M/L LIGAMAX5 (MISCELLANEOUS) ×2 IMPLANT
CLOTH BEACON ORANGE TIMEOUT ST (SAFETY) ×3 IMPLANT
COVER MAYO STAND STRL (DRAPES) IMPLANT
COVER SURGICAL LIGHT HANDLE (MISCELLANEOUS) ×3 IMPLANT
DECANTER SPIKE VIAL GLASS SM (MISCELLANEOUS) IMPLANT
DERMABOND ADHESIVE PROPEN (GAUZE/BANDAGES/DRESSINGS) ×1
DERMABOND ADVANCED (GAUZE/BANDAGES/DRESSINGS)
DERMABOND ADVANCED .7 DNX12 (GAUZE/BANDAGES/DRESSINGS) IMPLANT
DERMABOND ADVANCED .7 DNX6 (GAUZE/BANDAGES/DRESSINGS) ×2 IMPLANT
DRAPE C-ARM 42X72 X-RAY (DRAPES) IMPLANT
ELECT REM PT RETURN 9FT ADLT (ELECTROSURGICAL) ×3
ELECTRODE REM PT RTRN 9FT ADLT (ELECTROSURGICAL) ×2 IMPLANT
GLOVE BIO SURGEON STRL SZ7 (GLOVE) ×3 IMPLANT
GLOVE BIOGEL PI IND STRL 6.5 (GLOVE) ×4 IMPLANT
GLOVE BIOGEL PI IND STRL 7.0 (GLOVE) ×2 IMPLANT
GLOVE BIOGEL PI IND STRL 7.5 (GLOVE) ×2 IMPLANT
GLOVE BIOGEL PI INDICATOR 6.5 (GLOVE) ×2
GLOVE BIOGEL PI INDICATOR 7.0 (GLOVE) ×1
GLOVE BIOGEL PI INDICATOR 7.5 (GLOVE) ×1
GLOVE SURG SS PI 7.0 STRL IVOR (GLOVE) ×3 IMPLANT
GOWN STRL NON-REIN LRG LVL3 (GOWN DISPOSABLE) ×9 IMPLANT
HEMOSTAT SNOW SURGICEL 2X4 (HEMOSTASIS) ×3 IMPLANT
KIT BASIN OR (CUSTOM PROCEDURE TRAY) ×3 IMPLANT
KIT ROOM TURNOVER OR (KITS) ×3 IMPLANT
NS IRRIG 1000ML POUR BTL (IV SOLUTION) ×3 IMPLANT
PAD ARMBOARD 7.5X6 YLW CONV (MISCELLANEOUS) ×3 IMPLANT
POUCH SPECIMEN RETRIEVAL 10MM (ENDOMECHANICALS) ×3 IMPLANT
SCISSORS LAP 5X35 DISP (ENDOMECHANICALS) IMPLANT
SET CHOLANGIOGRAPH 5 50 .035 (SET/KITS/TRAYS/PACK) IMPLANT
SET IRRIG TUBING LAPAROSCOPIC (IRRIGATION / IRRIGATOR) ×3 IMPLANT
SLEEVE ENDOPATH XCEL 5M (ENDOMECHANICALS) ×6 IMPLANT
SPECIMEN JAR SMALL (MISCELLANEOUS) ×3 IMPLANT
SUT MNCRL AB 4-0 PS2 18 (SUTURE) ×3 IMPLANT
TOWEL OR 17X24 6PK STRL BLUE (TOWEL DISPOSABLE) ×3 IMPLANT
TOWEL OR 17X26 10 PK STRL BLUE (TOWEL DISPOSABLE) ×3 IMPLANT
TRAY LAPAROSCOPIC (CUSTOM PROCEDURE TRAY) ×3 IMPLANT
TROCAR XCEL BLUNT TIP 100MML (ENDOMECHANICALS) ×3 IMPLANT
TROCAR XCEL NON-BLD 5MMX100MML (ENDOMECHANICALS) ×3 IMPLANT

## 2011-09-06 NOTE — Transfer of Care (Signed)
Immediate Anesthesia Transfer of Care Note  Patient: Victoria Beasley  Procedure(s) Performed: Procedure(s) (LRB): LAPAROSCOPIC CHOLECYSTECTOMY (N/A)  Patient Location: PACU  Anesthesia Type: General  Level of Consciousness: awake, alert  and oriented  Airway & Oxygen Therapy: Patient Spontanous Breathing and Patient connected to nasal cannula oxygen  Post-op Assessment: Report given to PACU RN and Post -op Vital signs reviewed and stable  Post vital signs: Reviewed and stable  Complications: No apparent anesthesia complications

## 2011-09-06 NOTE — Progress Notes (Signed)
Dr. 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 Dr. Dwain Sarna states may take the demerol but if she still has nausea needs to stay over night

## 2011-09-06 NOTE — H&P (Signed)
HPI  This is a 76 her old female with a history of gastroesophageal reflux disease who I initially saw in May of 2012. She was seen by Dr. Arlyce Dice at that point and all of these are much better now. She has had 2 fairly significant episodes of right upper quadrant pain and nausea. She was then referred back for evaluation for discussion of a cholecystectomy. Her AST was elevated at 185 and her ALT was elevated at 83 on her last visit with a normal white blood cell count. She has an ultrasound in 2012 that shows fairly extensive cholelithiasis. She is complaining of these episodes after eating fatty foods of some right upper quadrant pain radiating to her back associated with nausea.    Past Medical History   Diagnosis  Date   .  Diverticulosis of colon (without mention of hemorrhage)    .  Stricture and stenosis of esophagus    .  Benign neoplasm of other and unspecified site of the digestive system    .  Depressive disorder, not elsewhere classified    .  Arthritis    .  GERD (gastroesophageal reflux disease)    .  Gallstones    .  Obesity    .  Hypertension    .  Esophageal stricture    .  Anemia    .  Heart palpitations     Past Surgical History   Procedure  Date   .  Cesarean section      x 2   .  Knee surgery      left   .  Vericose vein laser      x 3   .  Colonoscopy    .  Upper gastrointestinal endoscopy    .  Foot neuroma surgery      right    Family History   Problem  Relation  Age of Onset   .  Colon cancer  Neg Hx    .  Esophageal cancer  Cousin  65   .  Diabetes  Sister    .  Diabetes  Mother     Social History  History   Substance Use Topics   .  Smoking status:  Never Smoker   .  Smokeless tobacco:  Never Used   .  Alcohol Use:  1.8 oz/week     3 Glasses of wine per week    No Known Allergies  Current Outpatient Prescriptions   Medication  Sig  Dispense  Refill   .  cholecalciferol (VITAMIN D) 1000 UNITS tablet  Take 1,000 Units by mouth daily.     Marland Kitchen   esomeprazole (NEXIUM) 40 MG capsule  Take 1 capsule (40 mg total) by mouth daily.  90 capsule  4   .  hydrochlorothiazide (,MICROZIDE/HYDRODIURIL,) 12.5 MG capsule  Take 12.5 mg by mouth daily.     .  Multiple Vitamin (MULTIVITAMIN PO)  Take by mouth.     Marland Kitchen  NEXIUM 40 MG capsule      .  NON FORMULARY  Omega Joint Extra 3 capsules daily        Blood pressure 110/84, pulse 88, temperature 97.5 F (36.4 C), temperature source Temporal, resp. rate 14, height 5\' 5"  (1.651 m), weight 234 lb 3.2 oz (106.232 kg).  Physical Exam  Physical Exam  Vitals reviewed.  Constitutional: She appears well-developed and well-nourished.   Abdominal: Soft. Bowel sounds are normal. She exhibits no distension. There is no tenderness.  Assessment   Symptomatic cholelithiasis   Plan  I think pretty clearly her symptoms are referable to her gallbladder  I discussed the procedure in detail. The patient was given Agricultural engineer. We discussed the risks and benefits of a laparoscopic cholecystectomy and possible cholangiogram including, but not limited to bleeding, infection, injury to surrounding structures such as the intestine or liver, bile leak, retained gallstones, need to convert to an open procedure, prolonged diarrhea, blood clots such as DVT, common bile duct injury, anesthesia risks, and possible need for additional procedures. The likelihood of improvement in symptoms and return to the patient's normal status is good. We discussed the typical post-operative recovery course.

## 2011-09-06 NOTE — Anesthesia Postprocedure Evaluation (Signed)
  Anesthesia Post-op Note  Patient: Victoria Beasley  Procedure(s) Performed: Procedure(s) (LRB): LAPAROSCOPIC CHOLECYSTECTOMY (N/A)  Patient Location: PACU  Anesthesia Type: General  Level of Consciousness: awake  Airway and Oxygen Therapy: Patient Spontanous Breathing  Post-op Pain: mild  Post-op Assessment: Post-op Vital signs reviewed  Post-op Vital Signs: stable  Complications: No apparent anesthesia complications

## 2011-09-06 NOTE — Anesthesia Procedure Notes (Signed)
Procedure Name: Intubation Date/Time: 09/06/2011 1:14 PM Performed by: Stran Raper S Pre-anesthesia Checklist: Patient identified, Emergency Drugs available, Suction available, Patient being monitored and Timeout performed Patient Re-evaluated:Patient Re-evaluated prior to inductionOxygen Delivery Method: Circle system utilized Preoxygenation: Pre-oxygenation with 100% oxygen Intubation Type: IV induction Ventilation: Mask ventilation without difficulty Laryngoscope Size: Mac and 3 Grade View: Grade I Tube type: Oral Tube size: 7.5 mm Number of attempts: 1 Airway Equipment and Method: Stylet Placement Confirmation: ETT inserted through vocal cords under direct vision,  positive ETCO2 and breath sounds checked- equal and bilateral Secured at: 22 cm Tube secured with: Tape Dental Injury: Teeth and Oropharynx as per pre-operative assessment

## 2011-09-06 NOTE — Preoperative (Signed)
Beta Blockers   Reason not to administer Beta Blockers:Not Applicable 

## 2011-09-06 NOTE — Progress Notes (Signed)
Dr. Randa Evens called regarding nausea orders received

## 2011-09-06 NOTE — Interval H&P Note (Signed)
History and Physical Interval Note:  09/06/2011 12:54 PM  Victoria Beasley  has presented today for surgery, with the diagnosis of gallstone removal  The various methods of treatment have been discussed with the patient and family. After consideration of risks, benefits and other options for treatment, the patient has consented to  Procedure(s) (LRB): LAPAROSCOPIC CHOLECYSTECTOMY WITH INTRAOPERATIVE CHOLANGIOGRAM (N/A) as a surgical intervention .  The patient's history has been reviewed, patient examined, no change in status, stable for surgery.  I have reviewed the patient's chart and labs.  Questions were answered to the patient's satisfaction.     Tevyn Codd

## 2011-09-06 NOTE — Op Note (Signed)
Preoperative diagnosis: Biliary colic Postoperative diagnosis chronic cholecystitis Procedure: Laparoscopic cholecystectomy Surgeon: Dr. Harden Mo Anesthesia: Gen. Endotracheal Drains: None Specimens: Gallbladder and contents to pathology Estimated blood loss: Minimal Complications: None Sponge and needle count correct x2 at end of operation Disposition to recovery in stable condition  Indications: This is a 56 yo female who appears to have a history of biliary colic with an ultrasound that showed stones. She has a history of gastroesophageal reflux disease but she certainly looks like that some of her symptoms are referable to her gallbladder. We discussed a laparoscopic cholecystectomy with an attempt at a cholangiogram.  Procedure: After informed consent was obtained the patient was taken to the operating room. She was administered 1 g of intravenous cefoxitin. Sequential compression devices were placed on her legs. She was placed under general endotracheal anesthesia without complication. Her abdomen was prepped and draped in the standard sterile surgical fashion. A surgical timeout was then performed.  I infiltrated quarter percent Marcaine below her umbilicus. I then made a vertical incision with an 11 blade. I grasped her fascia and entered sharply. I then entered the peritoneum. I placed the 0 Vicryl pursestring suture through the fascia. I then placed a Hassan trocar and insufflated the abdomen to 15 mm mercury pressure. I then placed 3 further 5 mm ports in the epigastrium and right abdomen under direct vision without complication after infiltration with local anesthetic. I then grasped the gallbladder and retracted it cephalad. There were some adhesions from her omentum which I took down bluntly. She had some evidence of chronic cholecystitis. I then retractor gallbladder cephalad and lateral. I then took some time and dissected the triangle to obtain the critical view of safety. The  artery was very adherent to the cystic duct. I dissected this free distally and clipped and divided the artery. I wanted to do a cholangiogram but her cystic duct was fairly short and I decided against tdoing that. I then clipped the duct and divided it. I then removed the gallbladder from the liver bed. Her gallbladder was fused to the liver bed in a portion I did make a small entrance of the gallbladder with some spillage of bile but no stones. Eventually the gallbladder was removed and placed in a bag and removed from the umbilicus. I then obtained hemostasis. Irrigation was performed. I did place a piece of Surgicel into the bed of the liver. I then removed the [facility] trocar. There was no evidence of an entry injury. I tied down my Purstring suture. I added an additional figure-of-eight 0 Vicryl suture which completely better elevated the defect. I then desufflated the abdomen removed all trocars. I then closed these with 4 Monocryl and Dermabond. She tolerated this well was extubated and transferred to recovery stable.

## 2011-09-06 NOTE — Anesthesia Preprocedure Evaluation (Addendum)
Anesthesia Evaluation  Patient identified by MRN, date of birth, ID band Patient awake    Reviewed: Allergy & Precautions, H&P , NPO status , Patient's Chart, lab work & pertinent test results  History of Anesthesia Complications Negative for: history of anesthetic complications  Airway Mallampati: I TM Distance: >3 FB     Dental  (+) Teeth Intact and Dental Advisory Given   Pulmonary neg pulmonary ROS,  breath sounds clear to auscultation  Pulmonary exam normal       Cardiovascular hypertension, Pt. on medications Rhythm:Regular Rate:Normal     Neuro/Psych PSYCHIATRIC DISORDERS Depression negative neurological ROS     GI/Hepatic Neg liver ROS, GERD-  Medicated,  Endo/Other  Morbid obesity  Renal/GU negative Renal ROS     Musculoskeletal  (+) Arthritis -, Osteoarthritis,    Abdominal   Peds  Hematology negative hematology ROS (+)   Anesthesia Other Findings   Reproductive/Obstetrics negative OB ROS                          Anesthesia Physical Anesthesia Plan  ASA: II  Anesthesia Plan: General   Post-op Pain Management:    Induction: Intravenous  Airway Management Planned: Oral ETT  Additional Equipment:   Intra-op Plan:   Post-operative Plan: Extubation in OR  Informed Consent: I have reviewed the patients History and Physical, chart, labs and discussed the procedure including the risks, benefits and alternatives for the proposed anesthesia with the patient or authorized representative who has indicated his/her understanding and acceptance.   Dental advisory given  Plan Discussed with: CRNA, Anesthesiologist and Surgeon  Anesthesia Plan Comments:         Anesthesia Quick Evaluation

## 2011-09-07 ENCOUNTER — Encounter (HOSPITAL_COMMUNITY): Payer: Self-pay | Admitting: General Surgery

## 2011-09-07 ENCOUNTER — Telehealth (INDEPENDENT_AMBULATORY_CARE_PROVIDER_SITE_OTHER): Payer: Self-pay | Admitting: General Surgery

## 2011-09-07 NOTE — Telephone Encounter (Signed)
Pt discharged home to take Demerol that she had at home, as she in intolerant of Percocet.  At home she does not have the Demerol, but Tylenol #3.  She is nauseated taking it, secondary to the codeine.  Paged and updated Dr. Dwain Sarna, who is in surgery; asked if a partner in the office would write a script for her.  Dr. Michaell Cowing wrote:  Dilaudid 4 mg, # 40, 1/2-2 po Q6H prn pain, no refill.  Daughter, Leotis Shames, called to come in for script.

## 2011-09-08 ENCOUNTER — Telehealth (INDEPENDENT_AMBULATORY_CARE_PROVIDER_SITE_OTHER): Payer: Self-pay | Admitting: General Surgery

## 2011-09-08 NOTE — Telephone Encounter (Signed)
Pt calling to discuss nausea today.  She vomiting x1 (upon waking) and is sipping fluids.  Pain control discussed as well.  Paged and updated Dr. Dwain Sarna.  Called in Phenergan 12.5 mg, # 20, 1 po Q6H prn nausea/ vomiting, no refill to [facility]:  [phone].  Pt is aware.

## 2011-09-16 ENCOUNTER — Telehealth (INDEPENDENT_AMBULATORY_CARE_PROVIDER_SITE_OTHER): Payer: Self-pay

## 2011-09-16 NOTE — Telephone Encounter (Signed)
Pt called from work today.  She says since early this morning she has been having RUQ pain and nausea.  She did lift groceries yesterday, and is back at work today.  She works at [employer].  I advised her to go home, take her pain medicine and Phenergan, and rest over the weekend.  I instructed her to call the physician on call this weekend if she gets worse.  I think she has probably tried to do too much too soon.

## 2011-09-19 ENCOUNTER — Telehealth (INDEPENDENT_AMBULATORY_CARE_PROVIDER_SITE_OTHER): Payer: Self-pay

## 2011-09-19 NOTE — Telephone Encounter (Signed)
Follow up with patient today.  She left work last Friday w/ severe abdominal pain.  Pain subsided around 3:00pm and she felt fine until later that day when she had a bout of severe nausea and vomiting.  Afterwards, she felt completely fine.  She is having no pain at all today and no nausea.  Will f/u prn.

## 2011-09-19 NOTE — Telephone Encounter (Signed)
LMOV for pt to call and let us know how she is doing.  Will wait to hear from her before scheduling office visit with Dr. Dwain Sarna.

## 2011-09-19 NOTE — Telephone Encounter (Signed)
Can we check with her today to see how she is doing?  If she still has any issues I will see her in urg tomorrow.

## 2011-10-03 ENCOUNTER — Ambulatory Visit (INDEPENDENT_AMBULATORY_CARE_PROVIDER_SITE_OTHER): Payer: 59 | Admitting: General Surgery

## 2011-10-03 ENCOUNTER — Encounter (INDEPENDENT_AMBULATORY_CARE_PROVIDER_SITE_OTHER): Payer: Self-pay | Admitting: General Surgery

## 2011-10-03 VITALS — BP 122/72 | HR 78 | Resp 16 | Ht 65.0 in | Wt 231.0 lb

## 2011-10-03 DIAGNOSIS — Z09 Encounter for follow-up examination after completed treatment for conditions other than malignant neoplasm: Secondary | ICD-10-CM

## 2011-10-03 NOTE — Progress Notes (Signed)
Subjective:     Patient ID: Victoria Beasley, female   DOB: 07/08/1955, 56 y.o.   MRN: [id number]  HPI This is a 56 year old female who had a history of right upper quadrant pain and nausea and cholelithiasis noted on ultrasound. She underwent evaluation by gastroenterology with an endoscopy. She returned and continued to have pain that sound like he was biliary in nature. She and I had a long discussion about proceeding with cholecystectomy and decided to go ahead. She underwent laparoscopic cholecystectomy. Her pathology shows a fairly benign gallbladder it does not show any stones although there were some stones present at the operation.she had some pain after the operation as well as an episode after eating some coleslaw with some abdominal pain he required her to go from from work. She is doing very well now with no symptoms at all. She has not any recurrence of what she after the coleslaw. She is back to work now and doing well. Also in the interim her mom had a mild stroke and heart attack and was admitted to the hospital.  Review of Systems     Objective:   Physical Exam Healing incisions without infection    Assessment:     Status post laparoscopic cholecystectomy    Plan:     I'm not sure what the episode she had was due to. It has not recurred and it may very well up be from the coleslaw. We will plan on just following her at this point. If she has any recurrence of the symptoms I ask her to please call me. She is to return to full activity.

## 2012-02-13 ENCOUNTER — Other Ambulatory Visit (HOSPITAL_COMMUNITY): Payer: Self-pay | Admitting: Obstetrics & Gynecology

## 2012-02-13 DIAGNOSIS — Z1231 Encounter for screening mammogram for malignant neoplasm of breast: Secondary | ICD-10-CM

## 2012-02-29 ENCOUNTER — Encounter (HOSPITAL_COMMUNITY): Payer: Self-pay

## 2012-02-29 ENCOUNTER — Ambulatory Visit (HOSPITAL_COMMUNITY)
Admission: RE | Admit: 2012-02-29 | Discharge: 2012-02-29 | Disposition: A | Discharge status: Home / Self Care | Payer: 59 | Source: Ambulatory Visit | Attending: Obstetrics & Gynecology | Admitting: Obstetrics & Gynecology

## 2012-02-29 DIAGNOSIS — Z1231 Encounter for screening mammogram for malignant neoplasm of breast: Secondary | ICD-10-CM | POA: Insufficient documentation

## 2012-12-24 ENCOUNTER — Other Ambulatory Visit: Payer: Self-pay | Admitting: Obstetrics & Gynecology

## 2012-12-24 NOTE — Patient Instructions (Signed)
Your procedure is scheduled on: 01/04/2013  Enter through the Main Entrance of [facility] at: [id number]  Pick up the phone at the desk and dial [phone].  Call this number if you have problems the morning of surgery: [phone].  Remember: Do NOT eat food: AFTER MIDNIGHT 01/03/2013 Do NOT drink clear liquids after: AFTER MIDNIGHT 01/03/2013 Take these medicines the morning of surgery with a SIP OF WATER: NEXIUM, HYDROCHLOROTHIAZIDE  Do NOT wear jewelry (body piercing), make-up, or nail polish. Do NOT wear lotions, powders, or perfumes.  You may wear deoderant. Do NOT shave for 48 hours prior to surgery. Do NOT bring valuables to the hospital. Contacts, dentures, or bridgework may not be worn into surgery. Have a responsible adult drive you home and stay with you for 24 hours after your procedure

## 2012-12-25 ENCOUNTER — Encounter (HOSPITAL_COMMUNITY)
Admission: RE | Admit: 2012-12-25 | Discharge: 2012-12-25 | Disposition: A | Discharge status: Home / Self Care | Payer: 59 | Source: Ambulatory Visit | Attending: Obstetrics & Gynecology | Admitting: Obstetrics & Gynecology

## 2012-12-25 ENCOUNTER — Encounter (HOSPITAL_COMMUNITY): Payer: Self-pay

## 2012-12-25 DIAGNOSIS — Z01812 Encounter for preprocedural laboratory examination: Secondary | ICD-10-CM | POA: Insufficient documentation

## 2012-12-25 DIAGNOSIS — Z01818 Encounter for other preprocedural examination: Secondary | ICD-10-CM | POA: Insufficient documentation

## 2012-12-25 DIAGNOSIS — Z01810 Encounter for preprocedural cardiovascular examination: Secondary | ICD-10-CM | POA: Insufficient documentation

## 2012-12-25 HISTORY — DX: Lesion of plantar nerve, right lower limb: G57.61

## 2012-12-25 HISTORY — DX: Cardiac arrhythmia, unspecified: I49.9

## 2012-12-25 LAB — BASIC METABOLIC PANEL
CO2: 26 mEq/L (ref 19–32)
Calcium: 9.1 mg/dL (ref 8.4–10.5)
Creatinine, Ser: 0.61 mg/dL (ref 0.50–1.10)
GFR calc non Af Amer: >90 mL/min (ref >90)
Glucose, Bld: 108 mg/dL — ABNORMAL HIGH (ref 70–99)
Sodium: 137 mEq/L (ref 135–145)

## 2012-12-25 LAB — CBC
MCH: 29.2 pg (ref 26.0–34.0)
MCV: 87.7 fL (ref 78.0–100.0)
Platelets: 277 10*3/uL (ref 150–400)
RBC: 4.15 MIL/uL (ref 3.87–5.11)
RDW: 16.3 % — ABNORMAL HIGH (ref 11.5–15.5)

## 2013-01-03 NOTE — H&P (Addendum)
Victoria Beasley is an 57 y.o. female with irregular periods and recent prolonged bleeding. Office pelvic sonogram noted thick endometrial stripe after having bled for over 2 wks and failed progestin trial. Office endometrial biopsy and sonohysterogram failed due to cervical stenosis and patient chose to proceed with diag/ possible operative hysteroscopy and D&C.  Nl Paps No breast complaints.   Past Medical History  Diagnosis Date  . Diverticulosis of colon (without mention of hemorrhage)   . Stricture and stenosis of esophagus   . Benign neoplasm of other and unspecified site of the digestive system   . Depressive disorder, not elsewhere classified   . Arthritis   . GERD (gastroesophageal reflux disease)   . Gallstones   . Obesity   . Hypertension   . Esophageal stricture   . Anemia   . Heart palpitations   . Dysrhythmia     palpitations  . Morton's neuroma of right foot     Past Surgical History  Procedure Laterality Date  . Cesarean section      x 2  . Knee surgery      left  . Vericose vein laser      x 3  . Colonoscopy    . Upper gastrointestinal endoscopy    . Cholecystectomy  09/06/2011    Procedure: LAPAROSCOPIC CHOLECYSTECTOMY;  Surgeon: Emelia Loron, MD;  Location: [facility] [place] [facility];  Service: General;  Laterality: N/A;    Family History  Problem Relation Age of Onset  . Colon cancer Neg Hx   . Esophageal cancer Cousin 65  . Diabetes Sister   . Diabetes Mother     Social History:  reports that she has never smoked. She has never used smokeless tobacco. She reports that she drinks about 1.8 ounces of alcohol per week. She reports that she does not use illicit drugs.  Allergies: No Known Allergies  No prescriptions prior to admission    Review of Systems  Constitutional: Negative for fever.  Eyes: Negative for blurred vision.  Respiratory: Negative for shortness of breath.   Cardiovascular: Negative for chest pain.  Neurological: Negative for dizziness and  headaches.  Physical Exam BP 142/79  Temp(Src) 98.8 F (37.1 C) (Oral)  Resp 20  SpO2 99%  A&O x 3, no acute distress. Pleasant HEENT neg, no thyromegaly Lungs CTA bilat CV RRR, S1S2 normal Abdo soft, non tender, non acute Extr no edema/ tenderness Pelvic cervical stenosis   Assessment/Plan: 57 yo, perimenopausal female with periods of amenorrhea followed by prolonged heavy bleeding and failed progestin therapy. Here for diagnostic hysteroscopy, D&C, possible polypectomy.   Risks/complications of surgery reviewed incl infection, bleeding, damage to internal organs including bladder, bowels, ureters, blood vessels, other risks from anesthesia, VTE and delayed complications of any surgery, complications in future surgery reviewed.    Gaelen Brager R 01/03/2013, 10:27 PM   Addendum- Pt seen and examined, no change. Agrees with above plan. V.Delshawn Stech, MD

## 2013-01-04 ENCOUNTER — Encounter (HOSPITAL_COMMUNITY): Payer: 59 | Admitting: Anesthesiology

## 2013-01-04 ENCOUNTER — Ambulatory Visit (HOSPITAL_COMMUNITY): Payer: 59 | Admitting: Anesthesiology

## 2013-01-04 ENCOUNTER — Encounter (HOSPITAL_COMMUNITY): Payer: Self-pay

## 2013-01-04 ENCOUNTER — Ambulatory Visit (HOSPITAL_COMMUNITY)
Admission: RE | Admit: 2013-01-04 | Discharge: 2013-01-04 | Disposition: A | Discharge status: Home / Self Care | Payer: 59 | Source: Ambulatory Visit | Attending: Obstetrics & Gynecology | Admitting: Obstetrics & Gynecology

## 2013-01-04 ENCOUNTER — Encounter (HOSPITAL_COMMUNITY)
Admission: RE | Disposition: A | Discharge status: Home / Self Care | Payer: Self-pay | Source: Ambulatory Visit | Attending: Obstetrics & Gynecology

## 2013-01-04 DIAGNOSIS — N840 Polyp of corpus uteri: Secondary | ICD-10-CM | POA: Insufficient documentation

## 2013-01-04 DIAGNOSIS — Q5128 Other doubling of uterus, other specified: Secondary | ICD-10-CM | POA: Insufficient documentation

## 2013-01-04 DIAGNOSIS — F329 Major depressive disorder, single episode, unspecified: Secondary | ICD-10-CM

## 2013-01-04 DIAGNOSIS — K5730 Diverticulosis of large intestine without perforation or abscess without bleeding: Secondary | ICD-10-CM

## 2013-01-04 DIAGNOSIS — N920 Excessive and frequent menstruation with regular cycle: Secondary | ICD-10-CM | POA: Insufficient documentation

## 2013-01-04 DIAGNOSIS — D139 Benign neoplasm of ill-defined sites within the digestive system: Secondary | ICD-10-CM

## 2013-01-04 DIAGNOSIS — N926 Irregular menstruation, unspecified: Secondary | ICD-10-CM | POA: Insufficient documentation

## 2013-01-04 DIAGNOSIS — R9389 Abnormal findings on diagnostic imaging of other specified body structures: Secondary | ICD-10-CM

## 2013-01-04 DIAGNOSIS — K222 Esophageal obstruction: Secondary | ICD-10-CM

## 2013-01-04 DIAGNOSIS — N882 Stricture and stenosis of cervix uteri: Secondary | ICD-10-CM | POA: Insufficient documentation

## 2013-01-04 DIAGNOSIS — K219 Gastro-esophageal reflux disease without esophagitis: Secondary | ICD-10-CM

## 2013-01-04 HISTORY — PX: DILATATION & CURRETTAGE/HYSTEROSCOPY WITH RESECTOCOPE: SHX5572

## 2013-01-04 HISTORY — DX: Abnormal findings on diagnostic imaging of other specified body structures: R93.89

## 2013-01-04 HISTORY — DX: Excessive and frequent menstruation with regular cycle: N92.0

## 2013-01-04 LAB — PREGNANCY, URINE: Preg Test, Ur: NEGATIVE

## 2013-01-04 SURGERY — DILATATION & CURETTAGE/HYSTEROSCOPY WITH RESECTOCOPE
Anesthesia: General | Site: Cervix | Wound class: Clean Contaminated

## 2013-01-04 MED ORDER — FENTANYL CITRATE 0.05 MG/ML IJ SOLN
INTRAMUSCULAR | Status: AC
  Filled 2013-01-04: qty 2

## 2013-01-04 MED ORDER — KETOROLAC TROMETHAMINE 30 MG/ML IJ SOLN
INTRAMUSCULAR | Status: DC | PRN
  Administered 2013-01-04: 30 mg via INTRAVENOUS

## 2013-01-04 MED ORDER — KETOROLAC TROMETHAMINE 30 MG/ML IJ SOLN: 15.0000 mg | Freq: Once | INTRAMUSCULAR | Status: DC | PRN

## 2013-01-04 MED ORDER — ONDANSETRON HCL 4 MG/2ML IJ SOLN
INTRAMUSCULAR | Status: DC | PRN
  Administered 2013-01-04: 4 mg via INTRAVENOUS

## 2013-01-04 MED ORDER — METOCLOPRAMIDE HCL 5 MG/ML IJ SOLN: 10.0000 mg | Freq: Once | INTRAMUSCULAR | Status: DC | PRN

## 2013-01-04 MED ORDER — LIDOCAINE HCL (CARDIAC) 20 MG/ML IV SOLN
INTRAVENOUS | Status: DC | PRN
  Administered 2013-01-04: 70 mg via INTRAVENOUS
  Administered 2013-01-04: 30 mg via INTRAVENOUS

## 2013-01-04 MED ORDER — LACTATED RINGERS IV SOLN
INTRAVENOUS | Status: DC
  Administered 2013-01-04: 50 mL/h via INTRAVENOUS
  Administered 2013-01-04: 10:00:00 via INTRAVENOUS

## 2013-01-04 MED ORDER — LIDOCAINE HCL (CARDIAC) 20 MG/ML IV SOLN
INTRAVENOUS | Status: AC
  Filled 2013-01-04: qty 5

## 2013-01-04 MED ORDER — LIDOCAINE HCL 1 % IJ SOLN
INTRAMUSCULAR | Status: DC | PRN
  Administered 2013-01-04: 10 mL

## 2013-01-04 MED ORDER — MIDAZOLAM HCL 2 MG/2ML IJ SOLN
INTRAMUSCULAR | Status: DC | PRN
  Administered 2013-01-04: 2 mg via INTRAVENOUS

## 2013-01-04 MED ORDER — ONDANSETRON HCL 4 MG/2ML IJ SOLN
INTRAMUSCULAR | Status: AC
  Filled 2013-01-04: qty 2

## 2013-01-04 MED ORDER — FENTANYL CITRATE 0.05 MG/ML IJ SOLN
25.0000 ug | INTRAMUSCULAR | Status: DC | PRN
  Administered 2013-01-04: 50 ug via INTRAVENOUS

## 2013-01-04 MED ORDER — FENTANYL CITRATE 0.05 MG/ML IJ SOLN
INTRAMUSCULAR | Status: DC | PRN
  Administered 2013-01-04 (×2): 50 ug via INTRAVENOUS

## 2013-01-04 MED ORDER — MIDAZOLAM HCL 2 MG/2ML IJ SOLN
INTRAMUSCULAR | Status: AC
  Filled 2013-01-04: qty 2

## 2013-01-04 MED ORDER — MEPERIDINE HCL 25 MG/ML IJ SOLN: 6.2500 mg | INTRAMUSCULAR | Status: DC | PRN

## 2013-01-04 MED ORDER — KETOROLAC TROMETHAMINE 30 MG/ML IJ SOLN
INTRAMUSCULAR | Status: AC
  Filled 2013-01-04: qty 1

## 2013-01-04 MED ORDER — CEFAZOLIN SODIUM-DEXTROSE 2-3 GM-% IV SOLR
2.0000 g | INTRAVENOUS | Status: AC
  Administered 2013-01-04: 2 g via INTRAVENOUS

## 2013-01-04 MED ORDER — LIDOCAINE HCL 1 % IJ SOLN
INTRAMUSCULAR | Status: AC
  Filled 2013-01-04: qty 20

## 2013-01-04 MED ORDER — PROPOFOL 10 MG/ML IV BOLUS
INTRAVENOUS | Status: DC | PRN
  Administered 2013-01-04: 180 mg via INTRAVENOUS

## 2013-01-04 MED ORDER — PROPOFOL 10 MG/ML IV EMUL
INTRAVENOUS | Status: AC
  Filled 2013-01-04: qty 20

## 2013-01-04 MED ORDER — CEFAZOLIN SODIUM-DEXTROSE 2-3 GM-% IV SOLR
INTRAVENOUS | Status: AC
  Filled 2013-01-04: qty 50

## 2013-01-04 SURGICAL SUPPLY — 24 items
CANISTER SUCT 3000ML (MISCELLANEOUS) ×2 IMPLANT
CATH ROBINSON RED A/P 16FR (CATHETERS) ×2 IMPLANT
CLOTH BEACON ORANGE TIMEOUT ST (SAFETY) ×2 IMPLANT
CONTAINER PREFILL 10% NBF 60ML (FORM) ×4 IMPLANT
DRAPE HYSTEROSCOPY (DRAPE) ×2 IMPLANT
DRESSING TELFA 8X3 (GAUZE/BANDAGES/DRESSINGS) ×2 IMPLANT
ELECT REM PT RETURN 9FT ADLT (ELECTROSURGICAL) ×2
ELECTRODE REM PT RTRN 9FT ADLT (ELECTROSURGICAL) ×1 IMPLANT
ELECTRODE ROLLER VERSAPOINT (ELECTRODE) IMPLANT
ELECTRODE RT ANGLE VERSAPOINT (CUTTING LOOP) IMPLANT
GLOVE BIO SURGEON STRL SZ7 (GLOVE) ×2 IMPLANT
GLOVE BIOGEL PI IND STRL 7.0 (GLOVE) ×1 IMPLANT
GLOVE BIOGEL PI INDICATOR 7.0 (GLOVE) ×1
GLOVE SURG SS PI 7.0 STRL IVOR (GLOVE) ×4 IMPLANT
GOWN STRL REIN XL XLG (GOWN DISPOSABLE) ×4 IMPLANT
LOOP ANGLED CUTTING 22FR (CUTTING LOOP) IMPLANT
NEEDLE SPNL 22GX7 QUINCKE BK (NEEDLE) ×2 IMPLANT
PACK HYSTEROSCOPY LF (CUSTOM PROCEDURE TRAY) IMPLANT
PACK VAGINAL MINOR WOMEN LF (CUSTOM PROCEDURE TRAY) ×2 IMPLANT
PAD OB MATERNITY 4.3X12.25 (PERSONAL CARE ITEMS) ×2 IMPLANT
SYR CONTROL 10ML LL (SYRINGE) ×2 IMPLANT
TOWEL OR 17X24 6PK STRL BLUE (TOWEL DISPOSABLE) ×4 IMPLANT
TUBING HYDROFLEX HYSTEROSCOPY (TUBING) ×2 IMPLANT
WATER STERILE IRR 1000ML POUR (IV SOLUTION) ×2 IMPLANT

## 2013-01-04 NOTE — Anesthesia Preprocedure Evaluation (Addendum)
Anesthesia Evaluation  Patient identified by MRN, date of birth, ID band Patient awake    Reviewed: Allergy & Precautions, H&P , NPO status , Patient's Chart, lab work & pertinent test results, reviewed documented beta blocker date and time   History of Anesthesia Complications Negative for: history of anesthetic complications  Airway Mallampati: I TM Distance: >3 FB Neck ROM: full    Dental  (+) Teeth Intact   Pulmonary neg pulmonary ROS,  breath sounds clear to auscultation  Pulmonary exam normal       Cardiovascular Exercise Tolerance: Good hypertension, On Medications + dysrhythmias (palpitations - cards says PACs and PVCs) Rhythm:regular Rate:Normal     Neuro/Psych negative neurological ROS  negative psych ROS   GI/Hepatic Neg liver ROS, GERD-  ,Esophageal stricture diverticulosis   Endo/Other  Morbid obesity  Renal/GU      Musculoskeletal  (+) Arthritis -,   Abdominal   Peds  Hematology negative hematology ROS (+)   Anesthesia Other Findings   Reproductive/Obstetrics negative OB ROS                          Anesthesia Physical Anesthesia Plan  ASA: III  Anesthesia Plan: General LMA   Post-op Pain Management:    Induction:   Airway Management Planned:   Additional Equipment:   Intra-op Plan:   Post-operative Plan:   Informed Consent: I have reviewed the patients History and Physical, chart, labs and discussed the procedure including the risks, benefits and alternatives for the proposed anesthesia with the patient or authorized representative who has indicated his/her understanding and acceptance.   Dental Advisory Given  Plan Discussed with: CRNA and Surgeon  Anesthesia Plan Comments:         Anesthesia Quick Evaluation

## 2013-01-04 NOTE — Transfer of Care (Signed)
Immediate Anesthesia Transfer of Care Note  Patient: Victoria Beasley  Procedure(s) Performed: Procedure(s): DILATATION & CURETTAGE Diagnostic HYSTEROSCOPY  with resectoscope  (N/A)  Patient Location: PACU  Anesthesia Type:General  Level of Consciousness: awake, alert , oriented and patient cooperative  Airway & Oxygen Therapy: Patient Spontanous Breathing and Patient connected to nasal cannula oxygen  Post-op Assessment: Report given to PACU RN and Post -op Vital signs reviewed and stable  Post vital signs: Reviewed and stable  Complications: No apparent anesthesia complications

## 2013-01-04 NOTE — Anesthesia Postprocedure Evaluation (Signed)
  Anesthesia Post-op Note  Anesthesia Post Note  Patient: Victoria Beasley  Procedure(s) Performed: Procedure(s) (LRB): DILATATION & CURETTAGE Diagnostic HYSTEROSCOPY  with resectoscope  (N/A)  Anesthesia type: General  Patient location: PACU  Post pain: Pain level controlled  Post assessment: Post-op Vital signs reviewed  Last Vitals:  Filed Vitals:   01/04/13 1115  BP: 137/82  Pulse: 66  Temp: 36.6 C  Resp: 18    Post vital signs: Reviewed  Level of consciousness: sedated  Complications: No apparent anesthesia complications

## 2013-01-04 NOTE — Op Note (Signed)
Preoperative diagnosis: Menometrorrhagia, Perimenopausal. Thick endometrial stripe Postop diagnosis: as above.  Procedure: Hysteroscopic resection of polypoid endometrium and D&C                    Subseptate uterus  Anesthesia General via LMA and paracervical block Surgeon: Shea Evans, MD  Assistant: none IV fluids: LR 1200 cc  Estimated blood loss: <50 cc  Urine output: straight catheter preop   Complications none  Condition stable  Disposition PACU  Specimen: Endometrial polypoidal tissue with endometrial curettings   Procedure  Indication: 57 yo with irregular menses and recent menorrahagia. Office sono noted thickened endometrial stripe. Office endometrial biopsy was not successful due to cervical stenosis at internal os. Patient was counseled for hysteroscopy and D&C and reviewed risks/ complications including infection, bleeding, damage to internal organs, she understood and agrees, gave informed written consent.  Patient was brought to the operating room with IV running. Time out was carried out. She received pre-op 2 gm Ancef. She underwent general anesthesia via LMA without complications. She was given dorsolithotomy position. Parts were prepped and draped in standard fashion. Bladder was catheterized once. Bimanual exam revealed uterus to be anteverted and enlarged. Speculum was placed and cervix was grasped with single-tooth tenaculum. Cervical block with 10 cc 1% plain Xylocaine given. The uterus was sounded to 13 cm. Cervical os was dilated to 21 [place] dilator. Diagnostic Hysteroscope was introduced in the uterine cavity under vision, using Glycine for irrigation. Findings: Enlarged cavity with a partial septum noted. Thick polypoidal tissue noted all over but especially on the left half. Resectoscope was switched and entered under vision. Hysteroscopic resection of large polypoidal areas performed. Bleeding secured with coagulation. Resectoscope removed under vision. A sharp  curettage was performed and copious material obtained and sent to pathology. Glycine Fluid deficit 175 cc. All counts are correct x2. No complications. Patient brought to the recovery room in stable condition.  Patient will be discharged home today. Follow up in 2 weeks in office. Warning signs of infection and excessive bleeding reviewed.   V.Noheli Melder, MD.

## 2013-01-08 ENCOUNTER — Encounter (HOSPITAL_COMMUNITY): Payer: Self-pay | Admitting: Obstetrics & Gynecology

## 2013-11-28 ENCOUNTER — Other Ambulatory Visit (HOSPITAL_COMMUNITY): Payer: Self-pay | Admitting: Obstetrics & Gynecology

## 2013-11-28 ENCOUNTER — Other Ambulatory Visit (HOSPITAL_COMMUNITY): Payer: Self-pay | Admitting: Family Medicine

## 2013-11-28 DIAGNOSIS — Z1231 Encounter for screening mammogram for malignant neoplasm of breast: Secondary | ICD-10-CM

## 2013-12-09 ENCOUNTER — Ambulatory Visit (HOSPITAL_COMMUNITY)
Admission: RE | Admit: 2013-12-09 | Discharge: 2013-12-09 | Disposition: A | Discharge status: Home / Self Care | Payer: 59 | Source: Ambulatory Visit | Attending: Family Medicine | Admitting: Family Medicine

## 2013-12-09 DIAGNOSIS — Z1231 Encounter for screening mammogram for malignant neoplasm of breast: Secondary | ICD-10-CM | POA: Diagnosis present

## 2014-01-20 ENCOUNTER — Telehealth: Payer: Self-pay | Admitting: Gastroenterology

## 2014-01-20 NOTE — Telephone Encounter (Signed)
Patient has spell of loose stool and constipation. She admits to wiping to clean multiple times. Her complaint is of a sudden rectal pain not during a bowel movement. She reports the pain as being more internal. She will use moistened wipes. Appointment scheduled for evaluation.

## 2014-01-21 ENCOUNTER — Encounter: Payer: Self-pay | Admitting: Physician Assistant

## 2014-01-21 ENCOUNTER — Ambulatory Visit (INDEPENDENT_AMBULATORY_CARE_PROVIDER_SITE_OTHER): Payer: 59 | Admitting: Physician Assistant

## 2014-01-21 VITALS — BP 126/82 | HR 72 | Ht 65.0 in | Wt 256.7 lb

## 2014-01-21 DIAGNOSIS — K648 Other hemorrhoids: Secondary | ICD-10-CM

## 2014-01-21 DIAGNOSIS — K644 Residual hemorrhoidal skin tags: Secondary | ICD-10-CM

## 2014-01-21 MED ORDER — HYDROCORTISONE ACETATE 25 MG RE SUPP: 25.0000 mg | Freq: Two times a day (BID) | RECTAL | Status: DC

## 2014-01-21 MED ORDER — BENEFIBER PO POWD: ORAL | Status: AC

## 2014-01-21 MED ORDER — HYDROCORTISONE 2.5 % RE CREA: TOPICAL_CREAM | RECTAL | Status: DC

## 2014-01-21 NOTE — Progress Notes (Signed)
Patient ID: Victoria Beasley, female   DOB: 09-Sep-1955, 58 y.o.   MRN: [id number]    HPI:   Victoria Beasley is a 58 year old female self-referred due to rectal pain.  [name] had a colonoscopy years ago that revealed rare diverticuli. She says she has had a had a history of hemorrhoids through the years. Last Friday, she had a hard stool and had to sit for a while and strain. Later that day she began to have some rectal pain and was able to feel a lump near her rectum. She says she knows that she has skin tags near her rectum and has a hard time keeping them clean. Over the past several weeks she has been having in sensation of incomplete evacuation with some anal seepage. She has to wipe several times after each bowel movement and has shoe polish consistency stools. She will get up and go back to work and shortly thereafter have some rectal itching and some fecal staining in her undergarments. She has had no bright red blood per rectum or melena. She has no fever or chills. She has no abdominal pain.   Past Medical History  Diagnosis Date  . Diverticulosis of colon (without mention of hemorrhage)   . Stricture and stenosis of esophagus   . Benign neoplasm of other and unspecified site of the digestive system   . Depressive disorder, not elsewhere classified   . Arthritis   . GERD (gastroesophageal reflux disease)   . Gallstones   . Obesity   . Hypertension   . Esophageal stricture   . Anemia   . Heart palpitations   . Dysrhythmia     palpitations  . Morton's neuroma of right foot   . Endometrial thickening on ultra sound 01/04/2013  . Menorrhagia 01/04/2013    Past Surgical History  Procedure Laterality Date  . Cesarean section      x 2  . Knee surgery      left  . Vericose vein laser      x 3  . Colonoscopy    . Upper gastrointestinal endoscopy    . Cholecystectomy  09/06/2011    Procedure: LAPAROSCOPIC CHOLECYSTECTOMY;  Surgeon: Rolm Bookbinder, MD;  Location: [facility];  Service: General;   Laterality: N/A;  . Dilatation & currettage/hysteroscopy with resectocope N/A 01/04/2013    Procedure: DILATATION & CURETTAGE Diagnostic HYSTEROSCOPY  with resectoscope ;  Surgeon: Elveria Royals, MD;  Location: [facility];  Service: Gynecology;  Laterality: N/A;   Family History  Problem Relation Age of Onset  . Colon cancer Neg Hx   . Esophageal cancer Cousin 32  . Diabetes Sister   . Diabetes Mother    History  Substance Use Topics  . Smoking status: Never Smoker   . Smokeless tobacco: Never Used  . Alcohol Use: 1.8 oz/week    3 Glasses of wine per week     Comment: socially   Current Outpatient Prescriptions  Medication Sig Dispense Refill  . celecoxib (CELEBREX) 200 MG capsule Take 200 mg by mouth daily.    . cholecalciferol (VITAMIN D) 1000 UNITS tablet Take 1,000 Units by mouth daily.     . hydrochlorothiazide (,MICROZIDE/HYDRODIURIL,) 12.5 MG capsule Take 12.5 mg by mouth daily.      . Multiple Vitamin (MULTIVITAMIN PO) Take 1 tablet by mouth daily.     . NON FORMULARY Omega Joint Extra 3 capsules daily     . esomeprazole (NEXIUM) 40 MG capsule Take 1 capsule (40 mg total) by  mouth daily. 90 capsule 4   No current facility-administered medications for this visit.   No Known Allergies   Review of Systems: Gen: Denies any fever, chills, sweats, anorexia, fatigue, weakness, malaise, weight loss, and sleep disorder CV: Denies chest pain, angina, palpitations, syncope, orthopnea, PND, peripheral edema, and claudication. Resp: Denies dyspnea at rest, dyspnea with exercise, cough, sputum, wheezing, coughing up blood, and pleurisy. GI: Denies vomiting blood, jaundice, and fecal incontinence.   Denies dysphagia or odynophagia. GU : Denies urinary burning, blood in urine, urinary frequency, urinary hesitancy, nocturnal urination, and urinary incontinence. MS: Denies joint pain, limitation of movement, and swelling, stiffness, low back pain, extremity pain. Denies muscle weakness,  cramps, atrophy.  Derm: Denies rash, itching, dry skin, hives, moles, warts, or unhealing ulcers.  Psych: Denies depression, anxiety, memory loss, suicidal ideation, hallucinations, paranoia, and confusion. Heme: Denies bruising, bleeding, and enlarged lymph nodes. Neuro:  Denies any headaches, dizziness, paresthesias. Endo:  Denies any problems with DM, thyroid, adrenal function   Prior Endoscopies:  Colonoscopy 12/28/2006 showed rare diverticulum in the transverse colon descending colon to rectum was normal cecum was normal.  Physical Exam: BP 126/82 mmHg  Pulse 72  Ht 5\' 5"  (1.651 m)  Wt 256 lb 11.2 oz (116.438 kg)  BMI 42.72 kg/m2 Constitutional: Pleasant,well-developed, female in no acute distress. HEENT: Normocephalic and atraumatic. Conjunctivae are normal. No scleral icterus. Neck supple.  Cardiovascular: Normal rate, regular rhythm.  Pulmonary/chest: Effort normal and breath sounds normal. No wheezing, rales or rhonchi. Abdominal: Soft, nondistended, nontender. Bowel sounds active throughout. There are no masses palpable. No hepatomegaly. Rectal: skin tags and inflamed external hemorrhoids noted. Anoscopy revealed right posterior internal hemorrhoids, ni fissure appreciated. Extremities: no edema Lymphadenopathy: No cervical adenopathy noted. Neurological: Alert and oriented to person place and time. Skin: Skin is warm and dry. No rashes noted. Psychiatric: Normal mood and affect. Behavior is normal.  ASSESSMENT AND PLAN: 58 year old female with rectal discomfort found to have external and internal hemorrhoids. She's been instructed to use Benefiber heaping tablespoon daily to try to bulk her stools. She will use Tucks wipes after bowel movements. She will be given a trial of Anusol HC suppositories 1 per rectum twice daily for 10 days, and will use Anusol HC cream to the external area twice daily as needed as well she will follow up in 4 weeks sooner if  needed.    Alexina Niccoli, Vita Barley PA-C 01/21/2014, 9:20 AM

## 2014-01-21 NOTE — Patient Instructions (Addendum)
Hemorrhoid handout given for you to read and follow.  We have sent the following medications to your pharmacy for you to pick up at your convenience: Anusol [facility] suppositories use 1 per rectum twice a day for 10 days Anusol HC cream use 2-3 times per day for external hemorrhoids  Take Benefiber OTC 1 heaping tablespoon daily to bulk stool.  Purchase Tucks wipes OTC and use as needed.  Please schedule a 4 week return appointment with Cecille Rubin Hvozdovic, PA-C, please call in about a week in a half to schedule that.

## 2014-01-21 NOTE — Progress Notes (Signed)
Reviewed and agree with management. If symptoms don't significantly improve would consider band ligation of internal hemorrhoids. Sandy Salaam. Deatra Ina, M.D., [facility]

## 2014-12-15 ENCOUNTER — Other Ambulatory Visit (HOSPITAL_COMMUNITY): Payer: Self-pay | Admitting: Respiratory Therapy

## 2014-12-15 DIAGNOSIS — J45990 Exercise induced bronchospasm: Secondary | ICD-10-CM

## 2014-12-17 ENCOUNTER — Ambulatory Visit (HOSPITAL_COMMUNITY)
Admission: RE | Admit: 2014-12-17 | Discharge: 2014-12-17 | Disposition: A | Discharge status: Home / Self Care | Payer: 59 | Source: Ambulatory Visit | Attending: Family Medicine | Admitting: Family Medicine

## 2014-12-17 DIAGNOSIS — J45990 Exercise induced bronchospasm: Secondary | ICD-10-CM | POA: Diagnosis present

## 2014-12-17 LAB — PULMONARY FUNCTION TEST
DL/VA % pred: 90 %
DL/VA: 4.36 ml/min/mmHg/L
DLCO UNC % PRED: 85 %
DLCO UNC: 20.63 ml/min/mmHg
FEF 25-75 PRE: 3.09 L/s
FEF 25-75 Post: 3.9 L/sec
FEF2575-%Change-Post: 26 %
FEF2575-%PRED-POST: 166 %
FEF2575-%Pred-Pre: 131 %
FEV1-%CHANGE-POST: 4 %
FEV1-%PRED-POST: 106 %
FEV1-%PRED-PRE: 102 %
FEV1-Post: 2.73 L
FEV1-Pre: 2.61 L
FEV1FVC-%Change-Post: 5 %
FEV1FVC-%Pred-Pre: 106 %
FEV6-%Change-Post: 0 %
FEV6-%PRED-PRE: 97 %
FEV6-%Pred-Post: 97 %
FEV6-POST: 3.1 L
FEV6-Pre: 3.12 L
FEV6FVC-%Change-Post: 0 %
FEV6FVC-%PRED-POST: 102 %
FEV6FVC-%Pred-Pre: 102 %
FVC-%Change-Post: 0 %
FVC-%PRED-POST: 94 %
FVC-%PRED-PRE: 95 %
FVC-Post: 3.11 L
FVC-Pre: 3.14 L
POST FEV6/FVC RATIO: 100 %
PRE FEV1/FVC RATIO: 83 %
PRE FEV6/FVC RATIO: 99 %
Post FEV1/FVC ratio: 88 %
RV % pred: 102 %
RV: 2.04 L
TLC % pred: 102 %
TLC: 5.18 L

## 2014-12-17 MED ORDER — ALBUTEROL SULFATE (2.5 MG/3ML) 0.083% IN NEBU
2.5000 mg | INHALATION_SOLUTION | Freq: Once | RESPIRATORY_TRACT | Status: AC
  Administered 2014-12-17: 2.5 mg via RESPIRATORY_TRACT

## 2015-01-13 ENCOUNTER — Other Ambulatory Visit: Payer: Self-pay | Admitting: Physician Assistant

## 2015-01-14 ENCOUNTER — Other Ambulatory Visit: Payer: Self-pay | Admitting: Physician Assistant

## 2016-02-17 DIAGNOSIS — M170 Bilateral primary osteoarthritis of knee: Secondary | ICD-10-CM | POA: Diagnosis not present

## 2016-02-17 DIAGNOSIS — M1712 Unilateral primary osteoarthritis, left knee: Secondary | ICD-10-CM | POA: Diagnosis not present

## 2016-03-21 DIAGNOSIS — I10 Essential (primary) hypertension: Secondary | ICD-10-CM | POA: Diagnosis not present

## 2016-04-06 DIAGNOSIS — H5203 Hypermetropia, bilateral: Secondary | ICD-10-CM | POA: Diagnosis not present

## 2016-06-28 ENCOUNTER — Other Ambulatory Visit: Payer: Self-pay | Admitting: Family Medicine

## 2016-06-28 DIAGNOSIS — Z1231 Encounter for screening mammogram for malignant neoplasm of breast: Secondary | ICD-10-CM

## 2016-07-15 ENCOUNTER — Ambulatory Visit
Admission: RE | Admit: 2016-07-15 | Discharge: 2016-07-15 | Disposition: A | Discharge status: Home / Self Care | Payer: 59 | Source: Ambulatory Visit | Attending: Family Medicine | Admitting: Family Medicine

## 2016-07-15 DIAGNOSIS — Z1231 Encounter for screening mammogram for malignant neoplasm of breast: Secondary | ICD-10-CM | POA: Diagnosis not present

## 2016-09-09 ENCOUNTER — Emergency Department (HOSPITAL_COMMUNITY): Payer: 59

## 2016-09-09 ENCOUNTER — Encounter (HOSPITAL_COMMUNITY): Payer: Self-pay | Admitting: *Deleted

## 2016-09-09 ENCOUNTER — Emergency Department (HOSPITAL_COMMUNITY)
Admission: EM | Admit: 2016-09-09 | Discharge: 2016-09-09 | Disposition: A | Discharge status: Home / Self Care | Payer: 59 | Attending: Emergency Medicine | Admitting: Emergency Medicine

## 2016-09-09 DIAGNOSIS — M25531 Pain in right wrist: Secondary | ICD-10-CM | POA: Diagnosis not present

## 2016-09-09 DIAGNOSIS — S0121XA Laceration without foreign body of nose, initial encounter: Secondary | ICD-10-CM | POA: Insufficient documentation

## 2016-09-09 DIAGNOSIS — M79641 Pain in right hand: Secondary | ICD-10-CM | POA: Diagnosis not present

## 2016-09-09 DIAGNOSIS — Y9389 Activity, other specified: Secondary | ICD-10-CM | POA: Diagnosis not present

## 2016-09-09 DIAGNOSIS — I10 Essential (primary) hypertension: Secondary | ICD-10-CM | POA: Insufficient documentation

## 2016-09-09 DIAGNOSIS — S022XXA Fracture of nasal bones, initial encounter for closed fracture: Secondary | ICD-10-CM | POA: Diagnosis not present

## 2016-09-09 DIAGNOSIS — Y998 Other external cause status: Secondary | ICD-10-CM | POA: Diagnosis not present

## 2016-09-09 DIAGNOSIS — W01198A Fall on same level from slipping, tripping and stumbling with subsequent striking against other object, initial encounter: Secondary | ICD-10-CM | POA: Diagnosis not present

## 2016-09-09 DIAGNOSIS — Z79899 Other long term (current) drug therapy: Secondary | ICD-10-CM | POA: Insufficient documentation

## 2016-09-09 DIAGNOSIS — Y92008 Other place in unspecified non-institutional (private) residence as the place of occurrence of the external cause: Secondary | ICD-10-CM | POA: Insufficient documentation

## 2016-09-09 DIAGNOSIS — W19XXXA Unspecified fall, initial encounter: Secondary | ICD-10-CM

## 2016-09-09 DIAGNOSIS — S6991XA Unspecified injury of right wrist, hand and finger(s), initial encounter: Secondary | ICD-10-CM | POA: Diagnosis not present

## 2016-09-09 NOTE — ED Provider Notes (Signed)
[facility] DEPT Provider Note   CSN: [id number] Arrival date & time: 09/09/16  6270   By signing my name below, I, Victoria Beasley, attest that this documentation has been prepared under the direction and in the presence of Victoria Hora, PA-C. Electronically signed, Victoria Beasley, ED Scribe. 09/09/16. 10:40 AM.  History   Chief Complaint Chief Complaint  Patient presents with  . Fall   The history is provided by the patient and medical records. No language interpreter was used.    Victoria Beasley is a 61 y.o. female with h/o HTN, heart palpitations and dysrhythmia presenting to the Emergency Department concerning a laceration to her nose s/p a fall that occurred PTA. She states she tripped while walking in her driveway with her hands full and she fell forward onto her face, with her nose taking majority of the impact. Pt states she heard a "crunch" and felt a "pop" sensation in her nose on impact. She notes profuse bleeding initially; bleeding controlled on evaluation with gauze. She also notes mild intermittent R wrist pain associated with application of pressure to the hand. She also notes difficulty breathing through the nose currently. No swelling or wounds to the hand. No LOC. Anticoagulant use denied. No neck pain, headache, visual disturbances or any other complaints noted at this time.   Past Medical History:  Diagnosis Date  . Anemia   . Arthritis   . Benign neoplasm of other and unspecified site of the digestive system   . Depressive disorder, not elsewhere classified   . Diverticulosis of colon (without mention of hemorrhage)   . Dysrhythmia    palpitations  . Endometrial thickening on ultra sound 01/04/2013  . Esophageal stricture   . Gallstones   . GERD (gastroesophageal reflux disease)   . Heart palpitations   . Hypertension   . Menorrhagia 01/04/2013  . Morton's neuroma of right foot   . Obesity   . Stricture and stenosis of esophagus     Patient Active  Problem List   Diagnosis Date Noted  . Endometrial thickening on ultra sound 01/04/2013  . Menorrhagia 01/04/2013  . Esophageal reflux 07/22/2010  . DEPRESSION 04/24/2007  . DIVERTICULOSIS, COLON 12/28/2006  . BENIGN NEOPLASM [place] SITE DIGESTIVE SYSTEM 12/05/2006  . ESOPHAGEAL STRICTURE 12/05/2006    Past Surgical History:  Procedure Laterality Date  . CESAREAN SECTION     x 2  . CHOLECYSTECTOMY  09/06/2011   Procedure: LAPAROSCOPIC CHOLECYSTECTOMY;  Surgeon: Rolm Bookbinder, MD;  Location: [place];  Service: General;  Laterality: N/A;  . COLONOSCOPY    . DILATATION & CURRETTAGE/HYSTEROSCOPY WITH RESECTOCOPE N/A 01/04/2013   Procedure: DILATATION & CURETTAGE Diagnostic HYSTEROSCOPY  with resectoscope ;  Surgeon: Elveria Royals, MD;  Location: [facility];  Service: Gynecology;  Laterality: N/A;  . KNEE SURGERY     left  . UPPER GASTROINTESTINAL ENDOSCOPY    . vericose vein laser     x 3    OB History    No data available       Home Medications    Prior to Admission medications   Medication Sig Start Date End Date Taking? Authorizing Provider  celecoxib (CELEBREX) 200 MG capsule Take 200 mg by mouth daily.    [provider]  cholecalciferol (VITAMIN D) 1000 UNITS tablet Take 1,000 Units by mouth daily.     [provider]  esomeprazole (NEXIUM) 40 MG capsule Take 1 capsule (40 mg total) by mouth daily. 09/14/10 12/14/12  Inda Castle,  MD  hydrochlorothiazide (,MICROZIDE/HYDRODIURIL,) 12.5 MG capsule Take 12.5 mg by mouth daily.      [provider]  hydrocortisone (ANUSOL-HC) 2.5 % rectal cream Use  2-3 times per day for external hemorrhoids 01/21/14   Hvozdovic, Lori P, PA-C  hydrocortisone (ANUSOL-HC) 25 MG suppository Place 1 suppository (25 mg total) rectally 2 (two) times daily. 01/21/14   Hvozdovic, Lori P, PA-C  Multiple Vitamin (MULTIVITAMIN PO) Take 1 tablet by mouth daily.     [provider]  NON FORMULARY Omega Joint Extra  3 capsules daily     [provider]  Wheat Dextrin (BENEFIBER) POWD Take 1 heaping tablespoon by mouth in liquid daily to bulk stool 01/21/14   Hvozdovic, Vita Barley, PA-C    Family History Family History  Problem Relation Age of Onset  . Diabetes Mother   . Esophageal cancer Cousin 43  . Diabetes Sister   . Colon cancer Neg Hx     Social History Social History  Substance Use Topics  . Smoking status: Never Smoker  . Smokeless tobacco: Never Used  . Alcohol use 1.8 oz/week    3 Glasses of wine per week     Comment: socially     Allergies   Patient has no known allergies.   Review of Systems Review of Systems  Constitutional: Negative for diaphoresis and fever.  HENT: Negative for facial swelling.   Eyes: Negative for visual disturbance.  Gastrointestinal: Negative for nausea and vomiting.  Musculoskeletal: Positive for arthralgias and myalgias. Negative for back pain, gait problem, joint swelling and neck pain.  Skin: Positive for wound. Negative for color change.  Neurological: Negative for syncope, light-headedness and headaches.  All other systems reviewed and are negative.    Physical Exam Updated Vital Signs BP (!) 180/72 (BP Location: Left Arm)   Pulse 88   Temp (!) 97.2 F (36.2 C) (Oral)   Resp 14   Ht 5\' 4"  (1.626 m)   Wt 275 lb (124.7 kg)   SpO2 98%   BMI 47.20 kg/m   Physical Exam  Constitutional: She is oriented to person, place, and time. She appears well-developed and well-nourished. No distress.  HENT:  Head: Normocephalic and atraumatic.  Nose: Nose lacerations and sinus tenderness present. No nasal deformity or nasal septal hematoma. No epistaxis.  No foreign bodies.  Small jagged 1 cm laceration over the anterior portion of the nose. No epistaxis. No septal hematoma.  Eyes: Pupils are equal, round, and reactive to light. Conjunctivae and EOM are normal. Right eye exhibits no discharge. Left eye exhibits no discharge. No scleral  icterus.  Neck: Normal range of motion.  No midline tenderness.  Cardiovascular: Normal rate.   Pulmonary/Chest: Effort normal. No respiratory distress.  Abdominal: She exhibits no distension.  Musculoskeletal: Normal range of motion.  No swelling over the R hand. FROM of the wrist and the fingers. No tenderness over the medial aspect of the hand.  Neurological: She is alert and oriented to person, place, and time.  Skin: Skin is warm and dry.  Psychiatric: She has a normal mood and affect. Her behavior is normal.  Nursing note and vitals reviewed.    ED Treatments / Results  DIAGNOSTIC STUDIES: Oxygen Saturation is 98% on RA, NL by my interpretation.    COORDINATION OF CARE: 10:31 AM-Discussed next steps with pt. Pt verbalized understanding and is agreeable with the plan. Will order medication, clean/ repair wound and refer to ENT.   Labs (all labs ordered  are listed, but only abnormal results are displayed) Labs Reviewed - No data to display  EKG  EKG Interpretation None       Radiology Dg Hand Complete Right  Result Date: 09/09/2016 CLINICAL DATA:  Right lateral hand pain after fall on concrete. EXAM: RIGHT HAND - COMPLETE 3+ VIEW COMPARISON:  None. FINDINGS: No fracture or dislocation. Mild degenerative change involving in the DIP joint of the third digit with osteophytosis. Punctate ossicle is noted about the lateral aspect of the DIP joint of the second digit, likely the sequela of remote avulsive injury. No evidence of chondrocalcinosis. Regional soft tissues appear normal. No radiopaque foreign body. IMPRESSION: 1. No acute findings. 2. Mild degenerative change involving the DIP joint of the fifth digit. Electronically Signed   By: Sandi Mariscal M.D.   On: 09/09/2016 09:14   Ct Maxillofacial Wo Contrast  Result Date: 09/09/2016 CLINICAL DATA:  Fall this morning. EXAM: CT MAXILLOFACIAL WITHOUT CONTRAST TECHNIQUE: Multidetector CT imaging of the maxillofacial structures  was performed. Multiplanar CT image reconstructions were also generated. COMPARISON:  None. FINDINGS: Osseous: Fractures are noted within the tip of the nasal bones. Slight leftward nasal septal deviation, most likely chronic. Orbits: Negative. No traumatic or inflammatory finding. Sinuses: Clear Soft tissues: Soft tissue swelling and laceration over the nasal bone fractures. Limited intracranial: No significant or unexpected finding. IMPRESSION: Nasal bone fractures noted at the tip of the nasal bone. Overlying soft tissue laceration and soft tissue swelling. Electronically Signed   By: Rolm Baptise M.D.   On: 09/09/2016 09:43    Procedures .Marland KitchenLaceration Repair Date/Time: 09/09/2016 10:49 AM Performed by: Victoria Beasley MARIE Authorized by: Victoria Beasley MARIE   Consent:    Consent obtained:  Verbal   Consent given by:  Patient   Risks discussed:  Pain, poor wound healing, infection and vascular damage   Alternatives discussed:  Referral Anesthesia (see MAR for exact dosages):    Anesthesia method:  Local infiltration   Local anesthetic:  Lidocaine 2% w/o epi (1 cc) Laceration details:    Location:  Face   Face location:  Nose   Length (cm):  1 Repair type:    Repair type:  Simple Pre-procedure details:    Preparation:  Patient was prepped and draped in usual sterile fashion Exploration:    Hemostasis achieved with:  Direct pressure   Contaminated: no   Treatment:    Wound cleansed with: saf-cleanse.   Amount of cleaning:  Standard   Irrigation solution:  Sterile water   Irrigation method:  Syringe   Visualized foreign bodies/material removed: no   Skin repair:    Repair method:  Sutures   Suture size:  6-0   Suture material:  Nylon   Suture technique:  Simple interrupted   Number of sutures:  2 Approximation:    Approximation:  Close   Vermilion border: well-aligned   Post-procedure details:    Dressing:  Sterile dressing   Patient tolerance of procedure:  Tolerated well, no  immediate complications   (including critical care time)  Medications Ordered in ED Medications - No data to display   Initial Impression / Assessment and Plan / ED Course  I have reviewed the triage vital signs and the nursing notes.  Pertinent labs & imaging results that were available during my care of the patient were reviewed by me and considered in my medical decision making (see chart for details).  61 year old female presents with nasal fracture and laceration s/p mechanical  fall. Bleeding is controlled and there is no septal hematoma or severe deformity. Laceration was repaired and irrigated in the ED. Bottom of the wound visualized and bleeding controlled. 2 sutures placed. Wound care discussed and advised to return to have stitches removed in 5 days. I offered her tetanus prophylaxis and she declined and said she would follow up with PCP regarding this. Referral to ENT given. Return precautions discussed.   Final Clinical Impressions(s) / ED Diagnoses   Final diagnoses:  Fall, initial encounter  Closed fracture of nasal bone, initial encounter  Laceration of nose without complication, initial encounter    New Prescriptions New Prescriptions   No medications on file   I personally performed the services described in this documentation, which was scribed in my presence. The recorded information has been reviewed and is accurate.    Recardo Evangelist, PA-C 09/09/16 [place], Julie, MD 09/09/16 [phone]

## 2016-09-09 NOTE — ED Notes (Signed)
Returned from xray

## 2016-09-09 NOTE — ED Triage Notes (Signed)
Pt c/o R lateral hand pain & nose pain post fall today from standing position landing on concrete, denies neck or back pain, pt has 1 cm lac to mid nose, denies LOC, ambulatory, not on blood thinners, A&O x4

## 2016-09-09 NOTE — Discharge Instructions (Signed)
Keep area clean and dry. You can wash with soap and water after 24 hours Change bandage at least once daily, more if it is dirty Watch for signs of infection (redness, drainage) Have stitches removed in 5 days - there are 2 total Follow up with ENT

## 2016-09-14 DIAGNOSIS — S022XXA Fracture of nasal bones, initial encounter for closed fracture: Secondary | ICD-10-CM | POA: Diagnosis not present

## 2016-10-21 DIAGNOSIS — Z0000 Encounter for general adult medical examination without abnormal findings: Secondary | ICD-10-CM | POA: Diagnosis not present

## 2016-10-21 DIAGNOSIS — I10 Essential (primary) hypertension: Secondary | ICD-10-CM | POA: Diagnosis not present

## 2016-12-30 ENCOUNTER — Encounter: Payer: Self-pay | Admitting: Gastroenterology

## 2017-06-16 DIAGNOSIS — M170 Bilateral primary osteoarthritis of knee: Secondary | ICD-10-CM | POA: Diagnosis not present

## 2017-06-21 DIAGNOSIS — H5203 Hypermetropia, bilateral: Secondary | ICD-10-CM | POA: Diagnosis not present

## 2018-03-29 ENCOUNTER — Other Ambulatory Visit: Payer: Self-pay | Admitting: Family Medicine

## 2018-03-29 DIAGNOSIS — Z23 Encounter for immunization: Secondary | ICD-10-CM | POA: Diagnosis not present

## 2018-03-29 DIAGNOSIS — Z13220 Encounter for screening for lipoid disorders: Secondary | ICD-10-CM | POA: Diagnosis not present

## 2018-03-29 DIAGNOSIS — E2839 Other primary ovarian failure: Secondary | ICD-10-CM

## 2018-03-29 DIAGNOSIS — Z0000 Encounter for general adult medical examination without abnormal findings: Secondary | ICD-10-CM | POA: Diagnosis not present

## 2018-03-29 DIAGNOSIS — Z1159 Encounter for screening for other viral diseases: Secondary | ICD-10-CM | POA: Diagnosis not present

## 2018-03-29 DIAGNOSIS — Z1231 Encounter for screening mammogram for malignant neoplasm of breast: Secondary | ICD-10-CM

## 2018-05-30 ENCOUNTER — Ambulatory Visit: Payer: 59

## 2018-05-30 ENCOUNTER — Other Ambulatory Visit: Payer: 59

## 2018-07-05 DIAGNOSIS — H35033 Hypertensive retinopathy, bilateral: Secondary | ICD-10-CM | POA: Diagnosis not present

## 2018-07-05 DIAGNOSIS — H5203 Hypermetropia, bilateral: Secondary | ICD-10-CM | POA: Diagnosis not present

## 2018-07-24 ENCOUNTER — Other Ambulatory Visit: Payer: Self-pay

## 2018-07-24 ENCOUNTER — Ambulatory Visit
Admission: RE | Admit: 2018-07-24 | Discharge: 2018-07-24 | Disposition: A | Discharge status: Home / Self Care | Payer: 59 | Source: Ambulatory Visit | Attending: Family Medicine | Admitting: Family Medicine

## 2018-07-24 DIAGNOSIS — E2839 Other primary ovarian failure: Secondary | ICD-10-CM

## 2018-07-24 DIAGNOSIS — Z1231 Encounter for screening mammogram for malignant neoplasm of breast: Secondary | ICD-10-CM

## 2018-08-27 IMAGING — CT CT MAXILLOFACIAL W/O CM
3 of 6 series · 16 of 47 positions shown, 19 images · non-contrast
Comparison: None.

CLINICAL DATA: Fall this morning.

EXAM:
CT MAXILLOFACIAL WITHOUT CONTRAST
TECHNIQUE: Multidetector CT imaging of the maxillofacial structures was
performed. Multiplanar CT image reconstructions were also generated.

[Series 3: maxilllofacial 2.0 hr40 3 · axial · 0.30mm/px · z∈[-178,-40]mm · 11 of 81 slices shown, 14 images]
[im 6/81  brain]
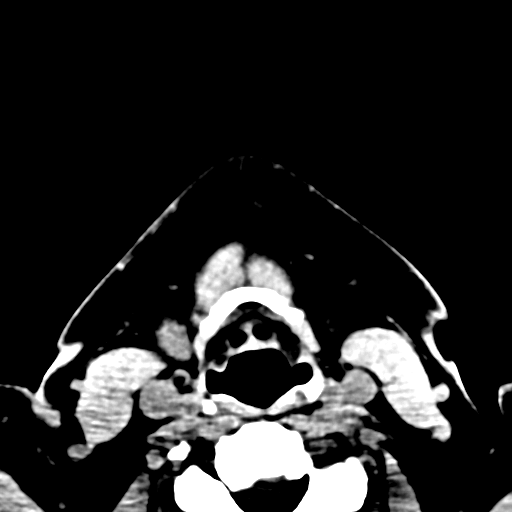
[im 6/81  bone]
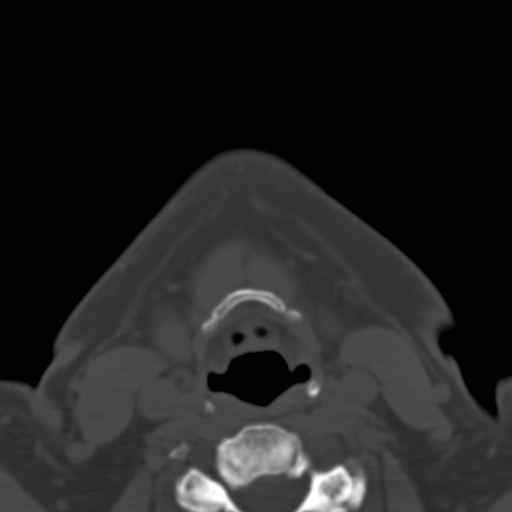
[im 12/81  bone]
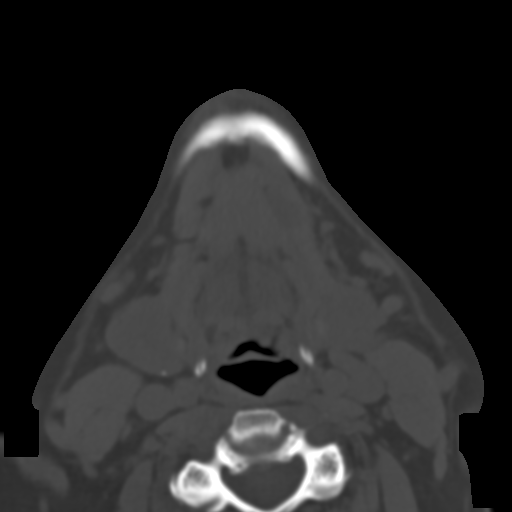
[im 18/81  bone]
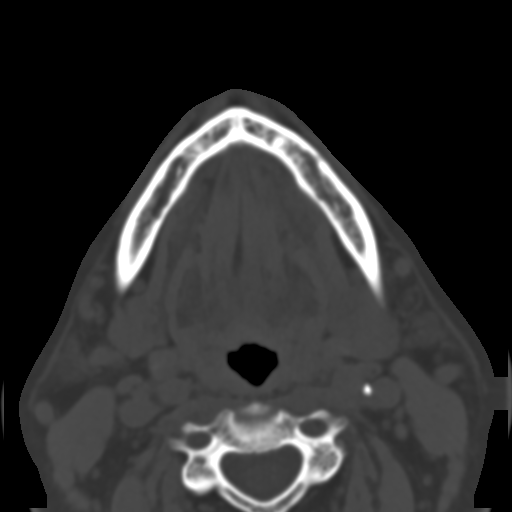
[im 29/81  bone]
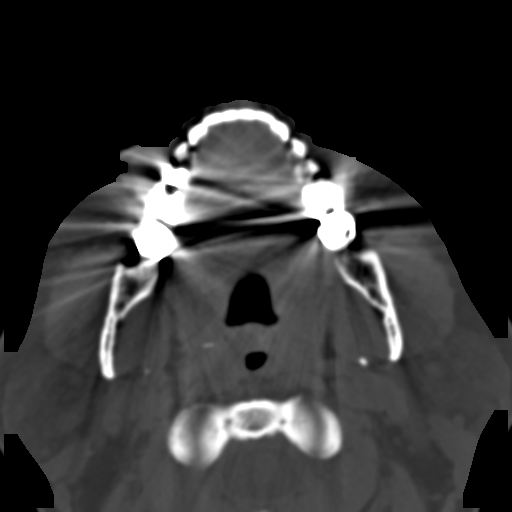
[im 35/81  brain]
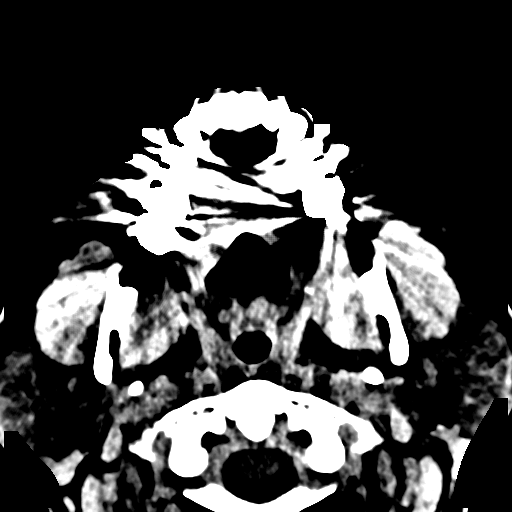
[im 35/81  bone]
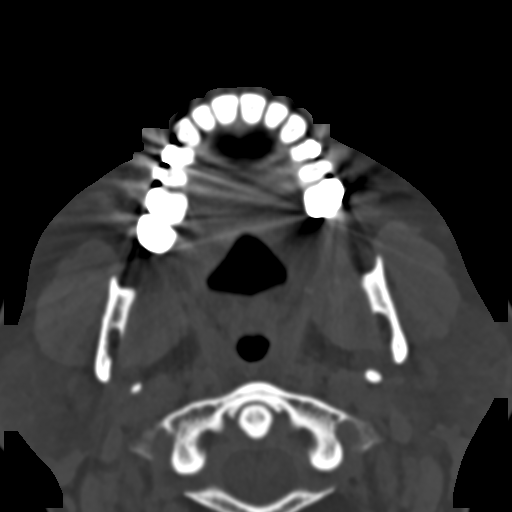
[im 41/81  bone]
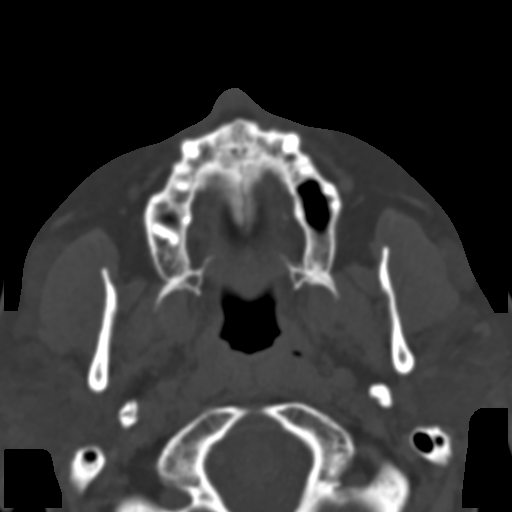
[im 46/81  bone]
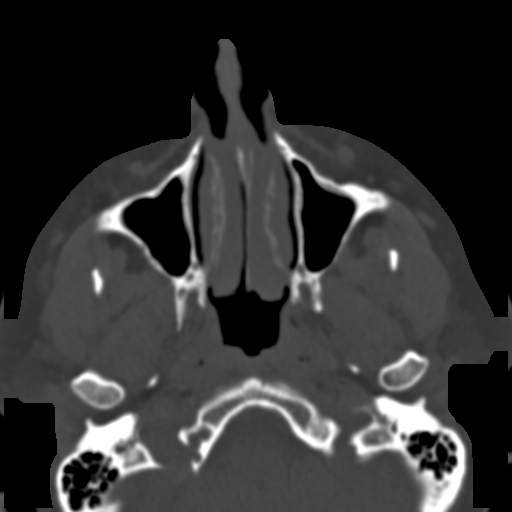
[im 52/81  bone]
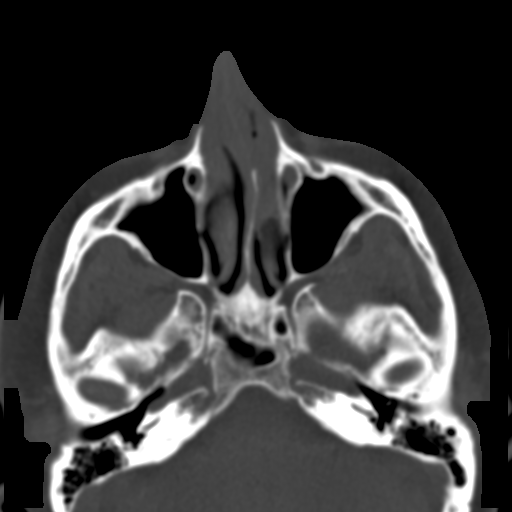
[im 63/81  brain]
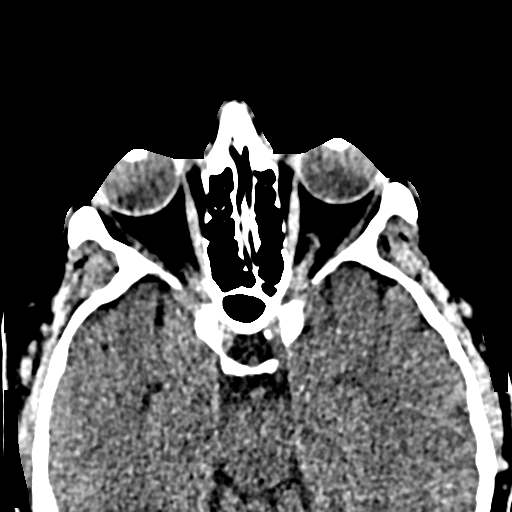
[im 63/81  bone]
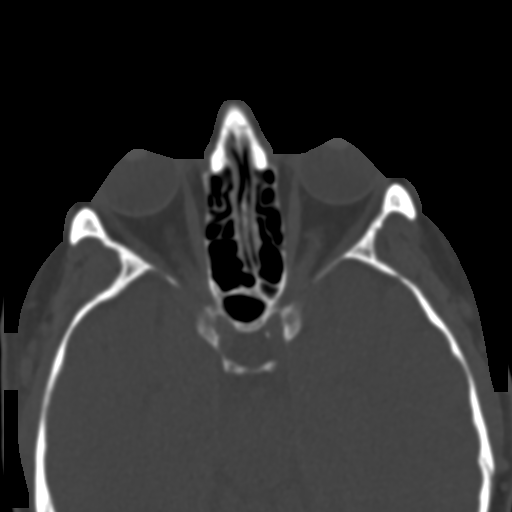
[im 69/81  bone]
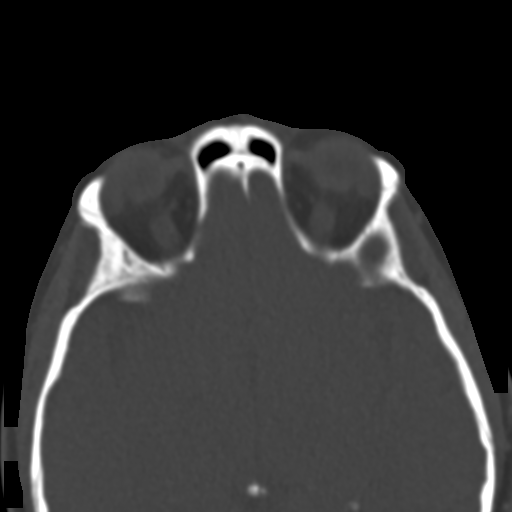
[im 75/81  bone]
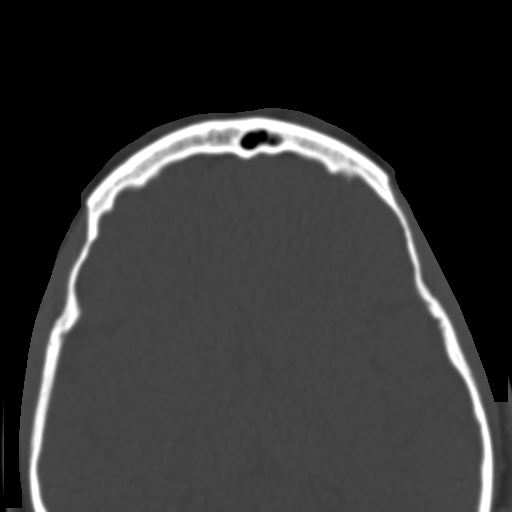

[Series 7: st cor · coronal · 0.31mm/px · 3 of 104 slices shown]
[im 26/104  bone]
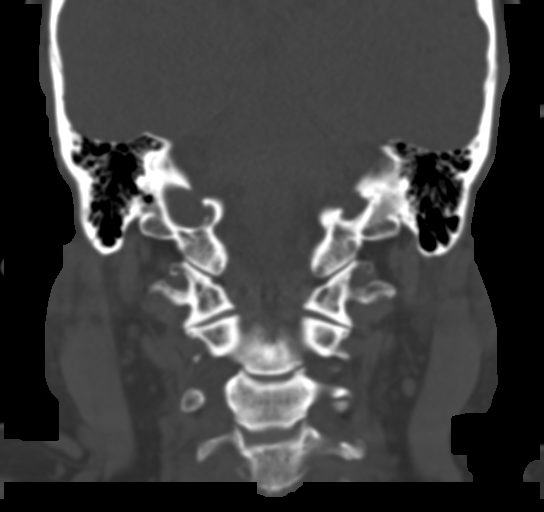
[im 52/104  bone]
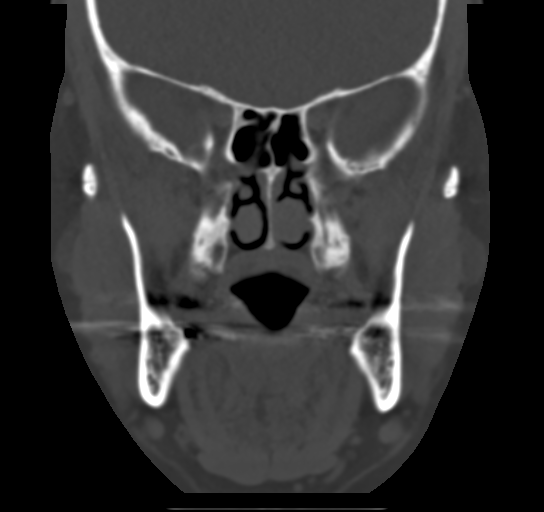
[im 78/104  bone]
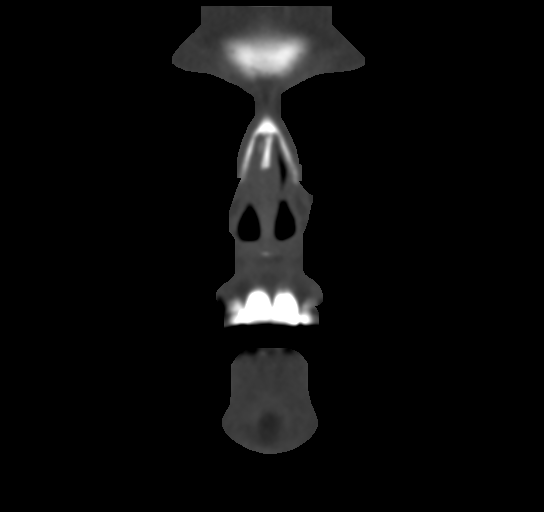

[Series 10: bone sag · sagittal · 0.31mm/px · 2 of 87 slices shown]
[im 29/87  bone]
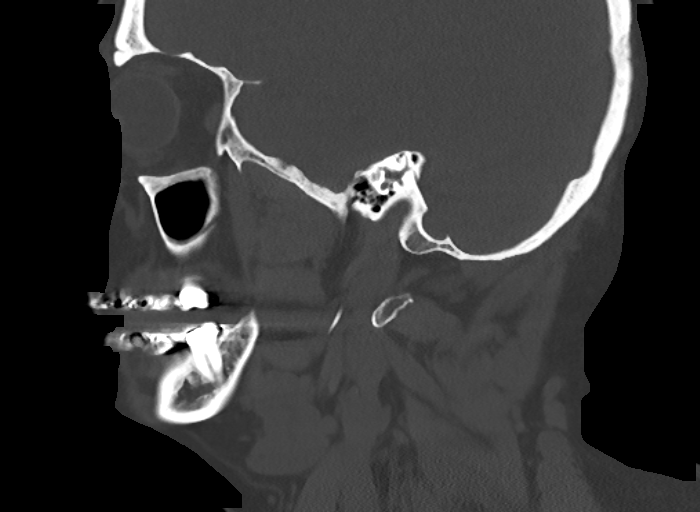
[im 58/87  bone]
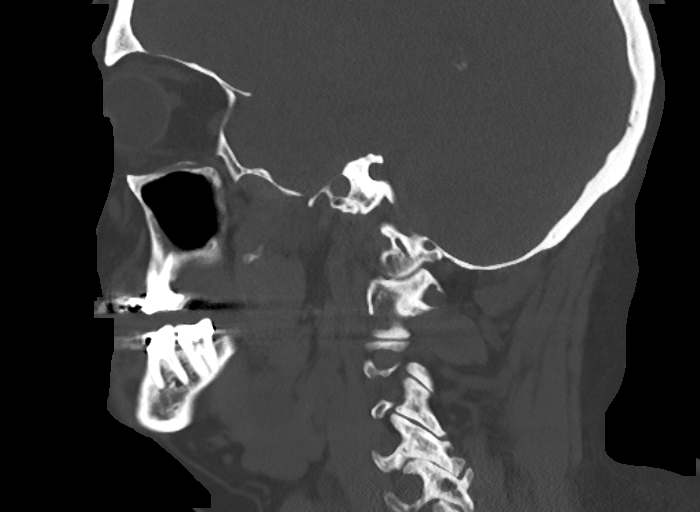

[16 of 47 positions shown; findings below may reference images not displayed]

FINDINGS: Osseous: Fractures are noted within the tip of the nasal bones.
Slight leftward nasal septal deviation, most likely chronic.

Orbits: Negative. No traumatic or inflammatory finding.

Sinuses: Clear

Soft tissues: Soft tissue swelling and laceration over the nasal
bone fractures.

Limited intracranial: No significant or unexpected finding.
IMPRESSION: Nasal bone fractures noted at the tip of the nasal bone. Overlying
soft tissue laceration and soft tissue swelling.

## 2019-04-24 ENCOUNTER — Other Ambulatory Visit: Payer: Self-pay | Admitting: Family Medicine

## 2019-04-24 ENCOUNTER — Other Ambulatory Visit (HOSPITAL_COMMUNITY)
Admission: RE | Admit: 2019-04-24 | Discharge: 2019-04-24 | Disposition: A | Discharge status: Home / Self Care | Payer: 59 | Source: Ambulatory Visit | Attending: Family Medicine | Admitting: Family Medicine

## 2019-04-24 DIAGNOSIS — Z124 Encounter for screening for malignant neoplasm of cervix: Secondary | ICD-10-CM | POA: Insufficient documentation

## 2019-04-25 ENCOUNTER — Encounter: Payer: Self-pay | Admitting: Gastroenterology

## 2019-04-25 LAB — CYTOLOGY - PAP
Adequacy: ABSENT
Diagnosis: NEGATIVE

## 2019-06-05 ENCOUNTER — Ambulatory Visit: Payer: 59 | Admitting: Gastroenterology

## 2019-06-05 ENCOUNTER — Encounter: Payer: Self-pay | Admitting: Gastroenterology

## 2019-06-05 VITALS — BP 118/74 | HR 72 | Temp 98.5°F | Ht 64.0 in | Wt 250.2 lb

## 2019-06-05 DIAGNOSIS — K581 Irritable bowel syndrome with constipation: Secondary | ICD-10-CM | POA: Diagnosis not present

## 2019-06-05 DIAGNOSIS — K219 Gastro-esophageal reflux disease without esophagitis: Secondary | ICD-10-CM | POA: Diagnosis not present

## 2019-06-05 DIAGNOSIS — Z1211 Encounter for screening for malignant neoplasm of colon: Secondary | ICD-10-CM

## 2019-06-05 DIAGNOSIS — K580 Irritable bowel syndrome with diarrhea: Secondary | ICD-10-CM

## 2019-06-05 NOTE — Progress Notes (Signed)
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  She was remotely followed by Dr. Deatra Ina.  Last office visit in 2015  Currently taking omeprazole 20 mg daily, heartburn improves if she stays on PPI but if she forgets to take it she has breakthrough symptoms  Occasional dysphagia to both liquids and solids, on average once a month.  Denies any difficulty swallowing bread or meat.  She has intentionally lost weight, feels better  Bowel habits irregular with alternating constipation and diarrhea.  No rectal bleeding or melena.  Family history negative for colon cancer  EGD August 04, 2010: Multiple benign-appearing gastric polyps, fundic gland otherwise normal exam  Colonoscopy December 28, 2006: Few small diverticula otherwise normal exam  EGD December 05, 2006: Esophageal stricture dilated with 17 mm Maloney, gastric polyps otherwise normal exam   Outpatient Encounter Medications as of 06/05/2019  Medication Sig  . celecoxib (CELEBREX) 200 MG capsule Take 200 mg by mouth daily.  . cholecalciferol (VITAMIN D) 1000 UNITS tablet Take 1,000 Units by mouth daily.   Marland Kitchen esomeprazole (NEXIUM) 40 MG capsule Take 1 capsule (40 mg total) by mouth daily. (Patient taking differently: 40 mg daily as needed. )  . fluticasone (FLONASE) 50 MCG/ACT nasal spray Place into both nostrils daily.  . hydrochlorothiazide (,MICROZIDE/HYDRODIURIL,) 12.5 MG capsule Take 12.5 mg by mouth daily.    Marland Kitchen loratadine (CLARITIN) 10 MG tablet Take 10 mg by mouth daily.  . Multiple Vitamin (MULTIVITAMIN PO) Take 1 tablet by mouth daily.   . NON FORMULARY Omega Joint Extra 3 capsules daily   . omeprazole (PRILOSEC) 20 MG capsule Take 20 mg by  mouth daily.  . Wheat Dextrin (BENEFIBER) POWD Take 1 heaping tablespoon by mouth in liquid daily to bulk stool  . hydrocortisone (ANUSOL-HC) 2.5 % rectal cream Use  2-3 times per day for external hemorrhoids (Patient not taking: Reported on 06/05/2019)  . hydrocortisone (ANUSOL-HC) 25 MG suppository Place 1 suppository (25 mg total) rectally 2 (two) times daily. (Patient not taking: Reported on 06/05/2019)   No facility-administered encounter medications on file as of 06/05/2019.    Allergies as of 06/05/2019  . (No Known Allergies)    Past Medical History:  Diagnosis Date  . Anemia   . Arthritis   . Benign neoplasm of other and unspecified site of the digestive system   . Depressive disorder, not elsewhere classified   . Diverticulosis of colon (without mention of hemorrhage)   . Dysrhythmia    palpitations  . Endometrial thickening on ultra sound 01/04/2013  . Esophageal stricture   . Gallstones   . GERD (gastroesophageal reflux disease)   . Heart palpitations   . Hypertension   . Menorrhagia 01/04/2013  . Morton's neuroma of right foot   . Obesity   . Stricture and stenosis of esophagus     Past Surgical History:  Procedure Laterality Date  . CESAREAN SECTION     x 2  . CHOLECYSTECTOMY  09/06/2011   Procedure: LAPAROSCOPIC CHOLECYSTECTOMY;  Surgeon: Rolm Bookbinder, MD;  Location: [facility];  Service: General;  Laterality: N/A;  . COLONOSCOPY    . DILATATION & CURRETTAGE/HYSTEROSCOPY WITH RESECTOCOPE N/A 01/04/2013  Procedure: DILATATION & CURETTAGE Diagnostic HYSTEROSCOPY  with resectoscope ;  Surgeon: Elveria Royals, MD;  Location: [facility];  Service: Gynecology;  Laterality: N/A;  . KNEE SURGERY     left  . UPPER GASTROINTESTINAL ENDOSCOPY    . vericose vein laser     x 3    Family History  Problem Relation Age of Onset  . Diabetes Mother   . Esophageal cancer Cousin 20  . Diabetes Sister   . Colon cancer Neg Hx     Social History   Socioeconomic History    . Marital status: Divorced    Spouse name: Not on file  . Number of children: 2  . Years of education: Not on file  . Highest education level: Not on file  Occupational History  . Occupation: NP    Employer: [employer]  Tobacco Use  . Smoking status: Never Smoker  . Smokeless tobacco: Never Used  Substance and Sexual Activity  . Alcohol use: Yes    Alcohol/week: 3.0 standard drinks    Types: 3 Glasses of wine per week    Comment: socially  . Drug use: No  . Sexual activity: Never  Other Topics Concern  . Not on file  Social History Narrative  . Not on file   Social Determinants of Health   Financial Resource Strain:   . Difficulty of Paying Living Expenses:   Food Insecurity:   . Worried About Charity fundraiser in the Last Year:   . Arboriculturist in the Last Year:   Transportation Needs:   . Film/video editor (Medical):   Marland Kitchen Lack of Transportation (Non-Medical):   Physical Activity:   . Days of Exercise per Week:   . Minutes of Exercise per Session:   Stress:   . Feeling of Stress :   Social Connections:   . Frequency of Communication with Friends and Family:   . Frequency of Social Gatherings with Friends and Family:   . Attends Religious Services:   . Active Member of Clubs or Organizations:   . Attends Archivist Meetings:   Marland Kitchen Marital Status:   Intimate Partner Violence:   . Fear of Current or Ex-Partner:   . Emotionally Abused:   Marland Kitchen Physically Abused:   . Sexually Abused:       Review of systems: All other review of systems negative except as mentioned in the HPI.   Physical Exam: Vitals:   06/05/19 0843  BP: 118/74  Pulse: 72  Temp: 98.5 F (36.9 C)   Body mass index is 42.95 kg/m. Gen:      No acute distress Neuro: alert and oriented x 3 Psych: normal mood and affect  Data Reviewed:  Reviewed labs, radiology imaging, old records and pertinent past GI work up   Assessment and Plan/Recommendations:  64 year old  female with history of obesity, colonic diverticulosis and chronic GERD  GERD: Symptoms are stable with daily PPI No significant dysphagia, odynophagia or atypical symptoms Continue omeprazole 20 mg daily.  We will plan to try to slowly wean off as tolerated.  Patient is worried about potential long-term side effects with PPI use We will hold off EGD, likely low yield given her symptoms are stable.  Reviewed last 2 EGDs by Dr. Deatra Ina Discussed antireflux measures and lifestyle modifications  Past due for colorectal cancer screening, schedule for colonoscopy The risks and benefits as well as alternatives of endoscopic procedure(s) have been discussed and reviewed. All questions  answered. The patient agrees to proceed.  Irregular bowel habits, irritable bowel syndrome with alternating constipation and diarrhea Start Benefiber 1 tablespoon twice daily with meals Increase dietary fiber and water intake  The patient was provided an opportunity to ask questions and all were answered. The patient agreed with the plan and demonstrated an understanding of the instructions.  Damaris Hippo , MD    CC: Kelton Pillar, MD

## 2019-06-05 NOTE — Patient Instructions (Addendum)
If you are age 65 or older, your body mass index should be between 23-30. Your Body mass index is 42.95 kg/m. If this is out of the aforementioned range listed, please consider follow up with your Primary Care Provider.  If you are age 42 or younger, your body mass index should be between 19-25. Your Body mass index is 42.95 kg/m. If this is out of the aformentioned range listed, please consider follow up with your Primary Care Provider.   You have been scheduled for a colonoscopy. Please follow written instructions given to you at your visit today.   (even only as needed), please bring them with you on the day of your procedure. Your physician has requested that you go to www.startemmi.com and enter the access code given to you at your visit today. This web site gives a general overview about your procedure. However, you should still follow specific instructions given to you by our office regarding your preparation for the procedure.  You have been given a Sutab sample prep.  Start Benefiber 1 tablespoon 1-2 times daily. This is over-the-counter.  Continue Omeprazole.  You have been given GERD literature.  Thank you for choosing me and [facility].   Harl Bowie, MD

## 2019-06-24 ENCOUNTER — Encounter: Payer: Self-pay | Admitting: Gastroenterology

## 2019-06-27 ENCOUNTER — Ambulatory Visit (AMBULATORY_SURGERY_CENTER): Payer: 59 | Admitting: Gastroenterology

## 2019-06-27 ENCOUNTER — Other Ambulatory Visit: Payer: Self-pay

## 2019-06-27 ENCOUNTER — Encounter: Payer: Self-pay | Admitting: Gastroenterology

## 2019-06-27 VITALS — BP 149/78 | HR 61 | Temp 97.1°F | Resp 13 | Ht 64.0 in | Wt 250.0 lb

## 2019-06-27 DIAGNOSIS — Z1211 Encounter for screening for malignant neoplasm of colon: Secondary | ICD-10-CM

## 2019-06-27 DIAGNOSIS — K635 Polyp of colon: Secondary | ICD-10-CM

## 2019-06-27 DIAGNOSIS — D122 Benign neoplasm of ascending colon: Secondary | ICD-10-CM

## 2019-06-27 DIAGNOSIS — D123 Benign neoplasm of transverse colon: Secondary | ICD-10-CM

## 2019-06-27 MED ORDER — SODIUM CHLORIDE 0.9 % IV SOLN: 500.0000 mL | Freq: Once | INTRAVENOUS | Status: DC

## 2019-06-27 NOTE — Patient Instructions (Signed)
Information on polyps, diverticulosis and hemorrhoids given to you today.  Await pathology results.  Resume previous medications and diet.YOU HAD AN ENDOSCOPIC PROCEDURE TODAY AT [facility]:   Refer to the procedure report that was given to you for any specific questions about what was found during the examination.  If the procedure report does not answer your questions, please call your gastroenterologist to clarify.  If you requested that your care partner not be given the details of your procedure findings, then the procedure report has been included in a sealed envelope for you to review at your convenience later.  YOU SHOULD EXPECT: Some feelings of bloating in the abdomen. Passage of more gas than usual.  Walking can help get rid of the air that was put into your GI tract during the procedure and reduce the bloating. If you had a lower endoscopy (such as a colonoscopy or flexible sigmoidoscopy) you may notice spotting of blood in your stool or on the toilet paper. If you underwent a bowel prep for your procedure, you may not have a normal bowel movement for a few days.  Please Note:  You might notice some irritation and congestion in your nose or some drainage.  This is from the oxygen used during your procedure.  There is no need for concern and it should clear up in a day or so.  SYMPTOMS TO REPORT IMMEDIATELY:   Following lower endoscopy (colonoscopy or flexible sigmoidoscopy):  Excessive amounts of blood in the stool  Significant tenderness or worsening of abdominal pains  Swelling of the abdomen that is new, acute  Fever of 100F or higher   For urgent or emergent issues, a gastroenterologist can be reached at any hour by calling [phone]. Do not use MyChart messaging for urgent concerns.    DIET:  We do recommend a small meal at first, but then you may proceed to your regular diet.  Drink plenty of fluids but you should avoid alcoholic beverages for 24  hours.  ACTIVITY:  You should plan to take it easy for the rest of today and you should NOT DRIVE or use heavy machinery until tomorrow (because of the sedation medicines used during the test).    FOLLOW UP: Our staff will call the number listed on your records 48-72 hours following your procedure to check on you and address any questions or concerns that you may have regarding the information given to you following your procedure. If we do not reach you, we will leave a message.  We will attempt to reach you two times.  During this call, we will ask if you have developed any symptoms of COVID 19. If you develop any symptoms (ie: fever, flu-like symptoms, shortness of breath, cough etc.) before then, please call [phone].  If you test positive for Covid 19 in the 2 weeks post procedure, please call and report this information to [place].    If any biopsies were taken you will be contacted by phone or by letter within the next 1-3 weeks.  Please call us at [phone] if you have not heard about the biopsies in 3 weeks.    SIGNATURES/CONFIDENTIALITY: You and/or your care partner have signed paperwork which will be entered into your electronic medical record.  These signatures attest to the fact that that the information above on your After Visit Summary has been reviewed and is understood.  Full responsibility of the confidentiality of this discharge information lies with you and/or your  care-partner.

## 2019-06-27 NOTE — Op Note (Addendum)
[facility] Patient Name: Victoria Beasley Procedure Date: 06/27/2019 12:52 PM MRN: [id number] Endoscopist: Mauri Pole , MD Age: 64 Referring MD:  Date of Birth: 1956-01-05 Gender: Female Account #: [id number] Procedure:                Colonoscopy Indications:              Screening for colorectal malignant neoplasm Medicines:                Monitored Anesthesia Care Procedure:                Pre-Anesthesia Assessment:                           - Prior to the procedure, a History and Physical                            was performed, and patient medications and                            allergies were reviewed. The patient's tolerance of                            previous anesthesia was also reviewed. The risks                            and benefits of the procedure and the sedation                            options and risks were discussed with the patient.                            All questions were answered, and informed consent                            was obtained. Prior Anticoagulants: The patient has                            taken no previous anticoagulant or antiplatelet                            agents. ASA Grade Assessment: II - A patient with                            mild systemic disease. After reviewing the risks                            and benefits, the patient was deemed in                            satisfactory condition to undergo the procedure.                           After obtaining informed consent, the colonoscope  was passed under direct vision. Throughout the                            procedure, the patient's blood pressure, pulse, and                            oxygen saturations were monitored continuously. The                            Colonoscope was introduced through the anus and                            advanced to the the cecum, identified by                            appendiceal orifice  and ileocecal valve. The                            colonoscopy was performed without difficulty. The                            patient tolerated the procedure well. The quality                            of the bowel preparation was excellent. The                            ileocecal valve, appendiceal orifice, and rectum                            were photographed. Scope In: 1:23:28 PM Scope Out: 1:56:59 PM Scope Withdrawal Time: 0 hours 28 minutes 9 seconds  Total Procedure Duration: 0 hours 33 minutes 31 seconds  Findings:                 The perianal and digital rectal examinations were                            normal.                           Two sessile polyps were found in the ascending                            colon. The polyps were 6 to 11 mm in size. These                            polyps were removed with a cold snare. Resection                            and retrieval were complete.                           Scattered small and large-mouthed diverticula were  found in the sigmoid colon and descending colon.                           Non-bleeding internal hemorrhoids were found during                            retroflexion. The hemorrhoids were small.                           The exam was otherwise without abnormality. Complications:            No immediate complications. Estimated Blood Loss:     Estimated blood loss was minimal. Impression:               - Two 6 to 11 mm polyps in the ascending colon,                            removed with a cold snare. Resected and retrieved.                           - Diverticulosis in the sigmoid colon and in the                            descending colon.                           - Non-bleeding internal hemorrhoids.                           - The examination was otherwise normal. Recommendation:           - Patient has a contact number available for                            emergencies. The  signs and symptoms of potential                            delayed complications were discussed with the                            patient. Return to normal activities tomorrow.                            Written discharge instructions were provided to the                            patient.                           - Resume previous diet.                           - Continue present medications.                           - Await pathology results.                           -  Repeat colonoscopy in 3 - 5 years for                            surveillance based on pathology results. Mauri Pole, MD 06/27/2019 2:06:20 PM This report has been signed electronically.

## 2019-06-27 NOTE — Progress Notes (Signed)
To PACU, VSS. Report to [email or web address] 

## 2019-06-27 NOTE — Progress Notes (Signed)
Temp by LS Vitals by CW  Pt's states no medical or surgical changes since previsit or office visit.

## 2019-06-27 NOTE — Progress Notes (Signed)
Called to room to assist during endoscopic procedure.  Patient ID and intended procedure confirmed with present staff. Received instructions for my participation in the procedure from the performing physician.  

## 2019-07-01 ENCOUNTER — Telehealth: Payer: Self-pay | Admitting: *Deleted

## 2019-07-01 NOTE — Telephone Encounter (Signed)
1. Have you developed a fever since your procedure? no  2.   Have you had an respiratory symptoms (SOB or cough) since your procedure? no  3.   Have you tested positive for COVID 19 since your procedure no  4.   Have you had any family members/close contacts diagnosed with the COVID 19 since your procedure?  no   If yes to any of these questions please route to Joylene John, RN and Erenest Rasher, RN Follow up Call-  Call back number 06/27/2019  Post procedure Call Back phone  # [phone]  Permission to leave phone message Yes  Some recent data might be hidden     Patient questions:  Do you have a fever, pain , or abdominal swelling? No. Pain Score  0 *  Have you tolerated food without any problems? Yes.    Have you been able to return to your normal activities? Yes.    Do you have any questions about your discharge instructions: Diet   No. Medications  No. Follow up visit  No.  Do you have questions or concerns about your Care? No.  Actions: * If pain score is 4 or above: No action needed, pain <4.

## 2019-07-04 ENCOUNTER — Encounter: Payer: Self-pay | Admitting: Gastroenterology

## 2019-07-19 ENCOUNTER — Ambulatory Visit (INDEPENDENT_AMBULATORY_CARE_PROVIDER_SITE_OTHER): Payer: 59

## 2019-07-19 ENCOUNTER — Ambulatory Visit (HOSPITAL_COMMUNITY)
Admission: EM | Admit: 2019-07-19 | Discharge: 2019-07-19 | Disposition: A | Discharge status: Home / Self Care | Payer: 59 | Attending: Urgent Care | Admitting: Urgent Care

## 2019-07-19 ENCOUNTER — Encounter (HOSPITAL_COMMUNITY): Payer: Self-pay

## 2019-07-19 ENCOUNTER — Other Ambulatory Visit: Payer: Self-pay

## 2019-07-19 DIAGNOSIS — M79642 Pain in left hand: Secondary | ICD-10-CM | POA: Diagnosis not present

## 2019-07-19 DIAGNOSIS — M79641 Pain in right hand: Secondary | ICD-10-CM | POA: Diagnosis not present

## 2019-07-19 DIAGNOSIS — S5012XA Contusion of left forearm, initial encounter: Secondary | ICD-10-CM

## 2019-07-19 DIAGNOSIS — T07XXXA Unspecified multiple injuries, initial encounter: Secondary | ICD-10-CM

## 2019-07-19 DIAGNOSIS — M79632 Pain in left forearm: Secondary | ICD-10-CM

## 2019-07-19 DIAGNOSIS — M25522 Pain in left elbow: Secondary | ICD-10-CM

## 2019-07-19 DIAGNOSIS — W19XXXA Unspecified fall, initial encounter: Secondary | ICD-10-CM

## 2019-07-19 DIAGNOSIS — S6000XA Contusion of unspecified finger without damage to nail, initial encounter: Secondary | ICD-10-CM

## 2019-07-19 DIAGNOSIS — M25531 Pain in right wrist: Secondary | ICD-10-CM

## 2019-07-19 DIAGNOSIS — S60212A Contusion of left wrist, initial encounter: Secondary | ICD-10-CM

## 2019-07-19 NOTE — ED Triage Notes (Signed)
Pt presents with bilateral hand injury and left wrist injury after a fall yesterday.

## 2019-07-19 NOTE — ED Provider Notes (Signed)
[facility]   MRN: [id number] DOB: May 18, 1955  Subjective:   Victoria Beasley is a 64 y.o. female presenting for 1 day history of suffering an accidental fall in a parking lot.  Patient tripped and fell making impact against a hard parking stop, fell with outstretched hands and made impact with her elbow and left breast.  Denies loss of consciousness.  She has had persistent bilateral hand and wrist pain, left forearm and elbow pain.  Has swelling of her left wrist and right fingers.  She has multiple cuts and bruises over her hands and fingers.  No current facility-administered medications for this encounter.  Current Outpatient Medications:  .  celecoxib (CELEBREX) 200 MG capsule, Take 200 mg by mouth daily., Disp: , Rfl:  .  cholecalciferol (VITAMIN D) 1000 UNITS tablet, Take 1,000 Units by mouth daily. , Disp: , Rfl:  .  esomeprazole (NEXIUM) 40 MG capsule, Take 1 capsule (40 mg total) by mouth daily. (Patient taking differently: 40 mg daily as needed. ), Disp: 90 capsule, Rfl: 4 .  fluticasone (FLONASE) 50 MCG/ACT nasal spray, Place into both nostrils daily., Disp: , Rfl:  .  hydrochlorothiazide (,MICROZIDE/HYDRODIURIL,) 12.5 MG capsule, Take 12.5 mg by mouth daily.  , Disp: , Rfl:  .  loratadine (CLARITIN) 10 MG tablet, Take 10 mg by mouth daily., Disp: , Rfl:  .  Multiple Vitamin (MULTIVITAMIN PO), Take 1 tablet by mouth daily. , Disp: , Rfl:  .  NON FORMULARY, Omega Joint Extra 3 capsules daily , Disp: , Rfl:  .  omeprazole (PRILOSEC) 20 MG capsule, Take 20 mg by mouth daily., Disp: , Rfl:  .  Wheat Dextrin (BENEFIBER) POWD, Take 1 heaping tablespoon by mouth in liquid daily to bulk stool (Patient not taking: Reported on 06/27/2019), Disp: , Rfl: 0   No Known Allergies  Past Medical History:  Diagnosis Date  . Anemia   . Arthritis   . Benign neoplasm of other and unspecified site of the digestive system   . Depression   . Depressive disorder, not elsewhere classified     . Diverticulosis of colon (without mention of hemorrhage)   . Dysrhythmia    palpitations  . Endometrial thickening on ultra sound 01/04/2013  . Esophageal stricture   . Gallstones   . Gallstones   . GERD (gastroesophageal reflux disease)   . Heart palpitations   . HTN (hypertension)   . Hypertension   . Menorrhagia 01/04/2013  . Morton's neuroma of right foot   . Obesity   . Obesity   . Stricture and stenosis of esophagus      Past Surgical History:  Procedure Laterality Date  . CESAREAN SECTION     x 2  . CHOLECYSTECTOMY  09/06/2011   Procedure: LAPAROSCOPIC CHOLECYSTECTOMY;  Surgeon: Rolm Bookbinder, MD;  Location: [facility];  Service: General;  Laterality: N/A;  . COLONOSCOPY    . DILATATION & CURRETTAGE/HYSTEROSCOPY WITH RESECTOCOPE N/A 01/04/2013   Procedure: DILATATION & CURETTAGE Diagnostic HYSTEROSCOPY  with resectoscope ;  Surgeon: Elveria Royals, MD;  Location: [facility];  Service: Gynecology;  Laterality: N/A;  . KNEE SURGERY     left  . UPPER GASTROINTESTINAL ENDOSCOPY    . vericose vein laser     x 3    Family History  Problem Relation Age of Onset  . Diabetes Mother   . Hypertension Father   . Esophageal cancer Cousin 17  . Diabetes Sister   . Colon cancer Neg Hx   .  Rectal cancer Neg Hx   . Stomach cancer Neg Hx     Social History   Tobacco Use  . Smoking status: Never Smoker  . Smokeless tobacco: Never Used  Substance Use Topics  . Alcohol use: Yes    Alcohol/week: 3.0 standard drinks    Types: 3 Glasses of wine per week    Comment: socially  . Drug use: No    ROS   Objective:   Vitals: BP (!) 142/93 (BP Location: Right Arm)   Pulse 64   Temp 97.9 F (36.6 C) (Oral)   Resp 17   SpO2 99%   Physical Exam Constitutional:      General: She is not in acute distress.    Appearance: Normal appearance. She is well-developed. She is not ill-appearing, toxic-appearing or diaphoretic.  HENT:     Head: Normocephalic and atraumatic.      Nose: Nose normal.     Mouth/Throat:     Mouth: Mucous membranes are moist.     Pharynx: Oropharynx is clear.  Eyes:     General: No scleral icterus.    Extraocular Movements: Extraocular movements intact.     Pupils: Pupils are equal, round, and reactive to light.  Cardiovascular:     Rate and Rhythm: Normal rate.  Pulmonary:     Effort: Pulmonary effort is normal.  Musculoskeletal:     Right forearm: No swelling, edema, deformity, lacerations, tenderness or bony tenderness.     Left forearm: Tenderness (by report only over lateral epicondyle, not elicited on exam) present. No swelling, edema, deformity, lacerations or bony tenderness.     Right wrist: No swelling, deformity, effusion, lacerations, tenderness, bony tenderness, snuff box tenderness or crepitus. Normal range of motion.     Left wrist: Swelling (with ecchymosis), tenderness (ulnar aspect), bony tenderness and crepitus (ulnar aspect) present. No snuff box tenderness. Decreased range of motion.     Right hand: Swelling (3rd, 4th fingers), tenderness (with multiple abrasions over fingers and hands) and bony tenderness present. No deformity or lacerations. Decreased range of motion (3rd, 4th fingers). Normal strength. Normal sensation. Normal capillary refill.     Left hand: No swelling, deformity, lacerations, tenderness or bony tenderness. Normal range of motion. Normal strength. Normal sensation. Normal capillary refill.       Hands:  Skin:    General: Skin is warm and dry.  Neurological:     General: No focal deficit present.     Mental Status: She is alert and oriented to person, place, and time.  Psychiatric:        Mood and Affect: Mood normal.        Behavior: Behavior normal.        Thought Content: Thought content normal.        Judgment: Judgment normal.     DG Wrist Complete Left  Result Date: 07/19/2019 CLINICAL DATA:  Fall.  Wrist injury. EXAM: LEFT WRIST - COMPLETE 3+ VIEW COMPARISON:  No prior. FINDINGS:  Mild degenerative change. No acute bony or joint abnormality identified. No evidence of fracture or dislocation. Soft tissues are unremarkable. IMPRESSION: Mild degenerative change.  No acute abnormality identified. Electronically Signed   By: Marcello Moores  Register   On: 07/19/2019 11:30   DG Hand Complete Right  Result Date: 07/19/2019 CLINICAL DATA:  Pain following fall EXAM: RIGHT HAND - COMPLETE 3+ VIEW COMPARISON:  September 09, 2016 FINDINGS: Frontal, oblique, and lateral views were obtained. No fracture or dislocation. There is osteoarthritic change in  the first carpal-metacarpal joint. There is osteoarthritic change in the first IP joint as well as in all DIP joints. No erosive change. IMPRESSION: Osteoarthritic change at several sites.  No fracture or dislocation. Electronically Signed   By: Lowella Grip III M.D.   On: 07/19/2019 11:27    Assessment and Plan :   PDMP not reviewed this encounter.  1. Bilateral hand pain   2. Bilateral wrist pain   3. Left forearm pain   4. Left elbow pain   5. Accidental fall, initial encounter   6. Contusion of left elbow and forearm, initial encounter   7. Abrasions of multiple sites     Left wrist wrapped using 2 inch Ace bandage, buddy tape system use for third and fourth fingers of the right hand.  Recommended conservative management including rest, NSAID for multiple contusions suffered from her fall. Counseled patient on potential for adverse effects with medications prescribed/recommended today, ER and return-to-clinic precautions discussed, patient verbalized understanding.    Jaynee Eagles, [place] 07/19/19 1202

## 2020-05-29 ENCOUNTER — Ambulatory Visit: Payer: Self-pay | Attending: Internal Medicine

## 2020-05-29 DIAGNOSIS — Z23 Encounter for immunization: Secondary | ICD-10-CM

## 2020-05-29 NOTE — Progress Notes (Signed)
   Covid-19 Vaccination Clinic  Name:  Victoria Beasley    MRN: [id number] DOB: [birth year]  05/29/2020  [name] was observed post Covid-19 immunization for 15 minutes without incident. She was provided with Vaccine Information Sheet and instruction to access the V-Safe system.   [name] was instructed to call 911 with any severe reactions post vaccine: Marland Kitchen Difficulty breathing  . Swelling of face and throat  . A fast heartbeat  . A bad rash all over body  . Dizziness and weakness   Immunizations Administered    Name Date Dose VIS Date Route   PFIZER Comrnaty(Gray TOP) Covid-19 Vaccine 05/29/2020  2:55 PM 0.3 mL 01/23/2020 Intramuscular   Manufacturer: [place]   Lot: [id number]   [place]: [phone]

## 2020-06-04 ENCOUNTER — Other Ambulatory Visit (HOSPITAL_BASED_OUTPATIENT_CLINIC_OR_DEPARTMENT_OTHER): Payer: Self-pay

## 2020-06-04 MED ORDER — PFIZER-BIONT COVID-19 VAC-TRIS 30 MCG/0.3ML IM SUSP
INTRAMUSCULAR | 0 refills | Status: AC
  Filled 2020-06-04: qty 0.3, 1d supply, fill #0

## 2020-06-16 ENCOUNTER — Other Ambulatory Visit: Payer: Self-pay | Admitting: Family Medicine

## 2020-06-16 DIAGNOSIS — Z1231 Encounter for screening mammogram for malignant neoplasm of breast: Secondary | ICD-10-CM

## 2020-08-07 ENCOUNTER — Ambulatory Visit
Admission: RE | Admit: 2020-08-07 | Discharge: 2020-08-07 | Disposition: A | Discharge status: Home / Self Care | Payer: Medicare Other | Source: Ambulatory Visit | Attending: Family Medicine | Admitting: Family Medicine

## 2020-08-07 ENCOUNTER — Other Ambulatory Visit: Payer: Self-pay

## 2020-08-07 DIAGNOSIS — Z1231 Encounter for screening mammogram for malignant neoplasm of breast: Secondary | ICD-10-CM

## 2020-08-10 ENCOUNTER — Other Ambulatory Visit: Payer: Self-pay | Admitting: Family Medicine

## 2020-08-10 DIAGNOSIS — R928 Other abnormal and inconclusive findings on diagnostic imaging of breast: Secondary | ICD-10-CM

## 2020-08-11 ENCOUNTER — Other Ambulatory Visit: Payer: Self-pay | Admitting: Family Medicine

## 2020-08-11 DIAGNOSIS — R928 Other abnormal and inconclusive findings on diagnostic imaging of breast: Secondary | ICD-10-CM

## 2020-09-02 ENCOUNTER — Ambulatory Visit
Admission: RE | Admit: 2020-09-02 | Discharge: 2020-09-02 | Disposition: A | Discharge status: Home / Self Care | Payer: Medicare Other | Source: Ambulatory Visit | Attending: Family Medicine | Admitting: Family Medicine

## 2020-09-02 ENCOUNTER — Other Ambulatory Visit: Payer: Self-pay

## 2020-09-02 DIAGNOSIS — R928 Other abnormal and inconclusive findings on diagnostic imaging of breast: Secondary | ICD-10-CM

## 2020-11-30 ENCOUNTER — Ambulatory Visit: Payer: Medicare Other | Attending: Internal Medicine

## 2020-11-30 DIAGNOSIS — Z23 Encounter for immunization: Secondary | ICD-10-CM

## 2020-11-30 NOTE — Progress Notes (Signed)
   Covid-19 Vaccination Clinic  Name:  Victoria Beasley    MRN: [id number] DOB: 04/06/1955  11/30/2020  Victoria Beasley was observed post Covid-19 immunization for 15 minutes without incident. She was provided with Vaccine Information Sheet and instruction to access the V-Safe system.   Victoria Beasley was instructed to call 911 with any severe reactions post vaccine: Difficulty breathing  Swelling of face and throat  A fast heartbeat  A bad rash all over body  Dizziness and weakness

## 2020-12-28 ENCOUNTER — Other Ambulatory Visit (HOSPITAL_BASED_OUTPATIENT_CLINIC_OR_DEPARTMENT_OTHER): Payer: Self-pay

## 2020-12-28 MED ORDER — PFIZER COVID-19 VAC BIVALENT 30 MCG/0.3ML IM SUSP
INTRAMUSCULAR | 0 refills | Status: AC
  Filled 2020-12-28: qty 0.3, 1d supply, fill #0

## 2021-03-29 ENCOUNTER — Other Ambulatory Visit: Payer: Self-pay | Admitting: Obstetrics and Gynecology

## 2021-03-29 MED ORDER — VALACYCLOVIR HCL 1 G PO TABS: 1000.0000 mg | ORAL_TABLET | Freq: Three times a day (TID) | ORAL | 1 refills | Status: AC

## 2021-08-09 ENCOUNTER — Other Ambulatory Visit: Payer: Self-pay | Admitting: Family Medicine

## 2021-08-09 DIAGNOSIS — Z1231 Encounter for screening mammogram for malignant neoplasm of breast: Secondary | ICD-10-CM

## 2021-08-11 ENCOUNTER — Other Ambulatory Visit: Payer: Self-pay | Admitting: Obstetrics & Gynecology

## 2021-08-12 ENCOUNTER — Encounter (HOSPITAL_BASED_OUTPATIENT_CLINIC_OR_DEPARTMENT_OTHER): Payer: Self-pay | Admitting: Obstetrics & Gynecology

## 2021-08-12 NOTE — Progress Notes (Signed)
Spoke w/ via phone for pre-op interview--- An Lab needs dos---- BMP and CBC per surgeon.              Lab results------ COVID test -----patient states asymptomatic no test needed Arrive at -------1130 NPO after MN NO Solid Food.  Clear liquids from MN until---1030 Med rec completed Medications to take morning of surgery ----- Omeprazole, Flonase Diabetic medication ----- Patient instructed no nail polish to be worn day of surgery Patient instructed to bring photo id and insurance card day of surgery Patient aware to have Driver (ride ) / caregiver Sister [name]  for 24 hours after surgery  Patient Special Instructions ----- Pre-Op special Istructions ----- Patient verbalized understanding of instructions that were given at this phone interview. Patient denies shortness of breath, chest pain, fever, cough at this phone interview.

## 2021-08-23 ENCOUNTER — Ambulatory Visit (HOSPITAL_BASED_OUTPATIENT_CLINIC_OR_DEPARTMENT_OTHER): Payer: Medicare Other | Admitting: Anesthesiology

## 2021-08-23 ENCOUNTER — Other Ambulatory Visit: Payer: Self-pay

## 2021-08-23 ENCOUNTER — Encounter (HOSPITAL_BASED_OUTPATIENT_CLINIC_OR_DEPARTMENT_OTHER)
Admission: RE | Disposition: A | Discharge status: Home / Self Care | Payer: Self-pay | Source: Home / Self Care | Attending: Obstetrics & Gynecology

## 2021-08-23 ENCOUNTER — Ambulatory Visit (HOSPITAL_BASED_OUTPATIENT_CLINIC_OR_DEPARTMENT_OTHER)
Admission: RE | Admit: 2021-08-23 | Discharge: 2021-08-23 | Disposition: A | Discharge status: Home / Self Care | Payer: Medicare Other | Attending: Obstetrics & Gynecology | Admitting: Obstetrics & Gynecology

## 2021-08-23 ENCOUNTER — Encounter (HOSPITAL_BASED_OUTPATIENT_CLINIC_OR_DEPARTMENT_OTHER): Payer: Self-pay | Admitting: Obstetrics & Gynecology

## 2021-08-23 DIAGNOSIS — N950 Postmenopausal bleeding: Secondary | ICD-10-CM | POA: Insufficient documentation

## 2021-08-23 DIAGNOSIS — I10 Essential (primary) hypertension: Secondary | ICD-10-CM

## 2021-08-23 DIAGNOSIS — N840 Polyp of corpus uteri: Secondary | ICD-10-CM | POA: Diagnosis not present

## 2021-08-23 DIAGNOSIS — Z79899 Other long term (current) drug therapy: Secondary | ICD-10-CM | POA: Insufficient documentation

## 2021-08-23 DIAGNOSIS — F32A Depression, unspecified: Secondary | ICD-10-CM | POA: Diagnosis not present

## 2021-08-23 DIAGNOSIS — Z6841 Body Mass Index (BMI) 40.0 and over, adult: Secondary | ICD-10-CM | POA: Diagnosis not present

## 2021-08-23 DIAGNOSIS — K219 Gastro-esophageal reflux disease without esophagitis: Secondary | ICD-10-CM | POA: Insufficient documentation

## 2021-08-23 HISTORY — PX: DILATATION & CURETTAGE/HYSTEROSCOPY WITH MYOSURE: SHX6511

## 2021-08-23 HISTORY — DX: Polyp of corpus uteri: N84.0

## 2021-08-23 HISTORY — DX: Other specified postprocedural states: Z98.890

## 2021-08-23 LAB — BASIC METABOLIC PANEL
Anion gap: 8 (ref 5–15)
BUN: 14 mg/dL (ref 8–23)
CO2: 24 mmol/L (ref 22–32)
Calcium: 9.1 mg/dL (ref 8.9–10.3)
Chloride: 105 mmol/L (ref 98–111)
Creatinine, Ser: 0.68 mg/dL (ref 0.44–1.00)
GFR, Estimated: >60 mL/min (ref >60)
Glucose, Bld: 92 mg/dL (ref 70–99)
Potassium: 4.1 mmol/L (ref 3.5–5.1)
Sodium: 137 mmol/L (ref 135–145)

## 2021-08-23 LAB — CBC
HCT: 38.4 % (ref 36.0–46.0)
Hemoglobin: 12.7 g/dL (ref 12.0–15.0)
MCH: 30.3 pg (ref 26.0–34.0)
MCHC: 33.1 g/dL (ref 30.0–36.0)
MCV: 91.6 fL (ref 80.0–100.0)
Platelets: 240 10*3/uL (ref 150–400)
RBC: 4.19 MIL/uL (ref 3.87–5.11)
RDW: 14.5 % (ref 11.5–15.5)
WBC: 8.9 10*3/uL (ref 4.0–10.5)
nRBC: 0 % (ref 0.0–0.2)

## 2021-08-23 SURGERY — DILATATION & CURETTAGE/HYSTEROSCOPY WITH MYOSURE
Anesthesia: General | Site: Vagina

## 2021-08-23 MED ORDER — MIDAZOLAM HCL 2 MG/2ML IJ SOLN
INTRAMUSCULAR | Status: AC
  Filled 2021-08-23: qty 2

## 2021-08-23 MED ORDER — PROPOFOL 10 MG/ML IV BOLUS
INTRAVENOUS | Status: AC
  Filled 2021-08-23: qty 20

## 2021-08-23 MED ORDER — SODIUM CHLORIDE 0.9 % IR SOLN
Status: DC | PRN
  Administered 2021-08-23: 3000 mL

## 2021-08-23 MED ORDER — ONDANSETRON HCL 4 MG/2ML IJ SOLN
INTRAMUSCULAR | Status: DC | PRN
  Administered 2021-08-23: 4 mg via INTRAVENOUS

## 2021-08-23 MED ORDER — AMISULPRIDE (ANTIEMETIC) 5 MG/2ML IV SOLN: 10.0000 mg | Freq: Once | INTRAVENOUS | Status: DC | PRN

## 2021-08-23 MED ORDER — CEFAZOLIN SODIUM-DEXTROSE 2-4 GM/100ML-% IV SOLN
2.0000 g | INTRAVENOUS | Status: AC
  Administered 2021-08-23: 2 g via INTRAVENOUS

## 2021-08-23 MED ORDER — KETOROLAC TROMETHAMINE 30 MG/ML IJ SOLN
INTRAMUSCULAR | Status: DC | PRN
  Administered 2021-08-23: 30 mg via INTRAVENOUS

## 2021-08-23 MED ORDER — ACETAMINOPHEN 500 MG PO TABS
ORAL_TABLET | ORAL | Status: AC
  Filled 2021-08-23: qty 2

## 2021-08-23 MED ORDER — FENTANYL CITRATE (PF) 100 MCG/2ML IJ SOLN: 25.0000 ug | INTRAMUSCULAR | Status: DC | PRN

## 2021-08-23 MED ORDER — ACETAMINOPHEN 500 MG PO TABS
1000.0000 mg | ORAL_TABLET | Freq: Once | ORAL | Status: AC
Start: 2021-08-23 — End: 2021-08-23
  Administered 2021-08-23: 1000 mg via ORAL

## 2021-08-23 MED ORDER — SCOPOLAMINE 1 MG/3DAYS TD PT72
MEDICATED_PATCH | TRANSDERMAL | Status: AC
  Filled 2021-08-23: qty 1

## 2021-08-23 MED ORDER — ONDANSETRON HCL 4 MG/2ML IJ SOLN
4.0000 mg | Freq: Once | INTRAMUSCULAR | Status: DC | PRN
Start: 2021-08-23 — End: 2021-08-23

## 2021-08-23 MED ORDER — LIDOCAINE-EPINEPHRINE 1 %-1:100000 IJ SOLN
INTRAMUSCULAR | Status: DC | PRN
  Administered 2021-08-23: 10 mL

## 2021-08-23 MED ORDER — OXYCODONE HCL 5 MG PO TABS: 5.0000 mg | ORAL_TABLET | Freq: Once | ORAL | Status: DC | PRN

## 2021-08-23 MED ORDER — POVIDONE-IODINE 10 % EX SWAB: 2.0000 | Freq: Once | CUTANEOUS | Status: DC

## 2021-08-23 MED ORDER — FENTANYL CITRATE (PF) 250 MCG/5ML IJ SOLN
INTRAMUSCULAR | Status: DC | PRN
Start: 2021-08-23 — End: 2021-08-23
  Administered 2021-08-23: 50 ug via INTRAVENOUS
  Administered 2021-08-23 (×2): 25 ug via INTRAVENOUS

## 2021-08-23 MED ORDER — SCOPOLAMINE 1 MG/3DAYS TD PT72
1.0000 | MEDICATED_PATCH | TRANSDERMAL | Status: DC
Start: 2021-08-23 — End: 2021-08-23
  Administered 2021-08-23: 1.5 mg via TRANSDERMAL

## 2021-08-23 MED ORDER — DEXAMETHASONE SODIUM PHOSPHATE 10 MG/ML IJ SOLN
INTRAMUSCULAR | Status: DC | PRN
  Administered 2021-08-23: 10 mg via INTRAVENOUS

## 2021-08-23 MED ORDER — LIDOCAINE 2% (20 MG/ML) 5 ML SYRINGE
INTRAMUSCULAR | Status: DC | PRN
  Administered 2021-08-23: 80 mg via INTRAVENOUS

## 2021-08-23 MED ORDER — LACTATED RINGERS IV SOLN: INTRAVENOUS | Status: DC

## 2021-08-23 MED ORDER — PROPOFOL 10 MG/ML IV BOLUS
INTRAVENOUS | Status: DC | PRN
  Administered 2021-08-23: 20 mg via INTRAVENOUS
  Administered 2021-08-23: 200 mg via INTRAVENOUS

## 2021-08-23 MED ORDER — FENTANYL CITRATE (PF) 100 MCG/2ML IJ SOLN
INTRAMUSCULAR | Status: AC
  Filled 2021-08-23: qty 2

## 2021-08-23 MED ORDER — OXYCODONE HCL 5 MG/5ML PO SOLN: 5.0000 mg | Freq: Once | ORAL | Status: DC | PRN

## 2021-08-23 MED ORDER — CEFAZOLIN SODIUM-DEXTROSE 2-4 GM/100ML-% IV SOLN
INTRAVENOUS | Status: AC
  Filled 2021-08-23: qty 100

## 2021-08-23 MED ORDER — MIDAZOLAM HCL 2 MG/2ML IJ SOLN
INTRAMUSCULAR | Status: DC | PRN
  Administered 2021-08-23: 2 mg via INTRAVENOUS

## 2021-08-23 SURGICAL SUPPLY — 18 items
CATH ROBINSON RED A/P 16FR (CATHETERS) ×1 IMPLANT
DEVICE MYOSURE LITE (MISCELLANEOUS) ×1 IMPLANT
DEVICE MYOSURE REACH (MISCELLANEOUS) IMPLANT
DRSG TELFA 3X8 NADH (GAUZE/BANDAGES/DRESSINGS) IMPLANT
GAUZE 4X4 16PLY ~~LOC~~+RFID DBL (SPONGE) ×2 IMPLANT
GLOVE BIO SURGEON STRL SZ7 (GLOVE) ×2 IMPLANT
GLOVE BIOGEL PI IND STRL 7.0 (GLOVE) ×2 IMPLANT
GLOVE BIOGEL PI INDICATOR 7.0 (GLOVE) ×2
GOWN STRL REUS W/TWL LRG LVL3 (GOWN DISPOSABLE) ×2 IMPLANT
IV NS IRRIG 3000ML ARTHROMATIC (IV SOLUTION) ×2 IMPLANT
KIT PROCEDURE FLUENT (KITS) ×2 IMPLANT
KIT TURNOVER CYSTO (KITS) ×2 IMPLANT
PACK VAGINAL MINOR WOMEN LF (CUSTOM PROCEDURE TRAY) ×2 IMPLANT
PAD DRESSING TELFA 3X8 NADH (GAUZE/BANDAGES/DRESSINGS) ×1 IMPLANT
PAD OB MATERNITY 4.3X12.25 (PERSONAL CARE ITEMS) ×2 IMPLANT
SEAL CERVICAL OMNI LOK (ABLATOR) IMPLANT
SEAL ROD LENS SCOPE MYOSURE (ABLATOR) ×2 IMPLANT
TOWEL OR 17X26 10 PK STRL BLUE (TOWEL DISPOSABLE) ×2 IMPLANT

## 2021-08-23 NOTE — Transfer of Care (Signed)
Immediate Anesthesia Transfer of Care Note  Patient: Victoria Beasley  Procedure(s) Performed: DILATATION & CURETTAGE/HYSTEROSCOPY WITH MYOSURE (Vagina )  Patient Location: PACU  Anesthesia Type:General  Level of Consciousness: awake, alert  and oriented  Airway & Oxygen Therapy: Patient Spontanous Breathing  Post-op Assessment: Report given to RN and Post -op Vital signs reviewed and stable  Post vital signs: Reviewed and stable  Last Vitals:  Vitals Value Taken Time  BP    Temp 36.5 C 08/23/21 1423  Pulse 76 08/23/21 1423  Resp 9 08/23/21 1423  SpO2 96 % 08/23/21 1423  Vitals shown include unvalidated device data.  Last Pain:  Vitals:   08/23/21 1206  TempSrc: Oral  PainSc: 6       Patients Stated Pain Goal: 6 (51/89/84 2103)  Complications: No notable events documented.

## 2021-08-23 NOTE — Op Note (Signed)
08/23/21 Victoria Beasley  Preoperative diagnosis: Postmenopausal bleeding, endometrial polyp  Postop diagnosis: as above.  Procedure: Hysteroscopic polypectomy, D&C using Myosure  Anesthesia General via LMA  Surgeon: Azucena Fallen, MD  Assistant:NA IV fluids 500 cc LR Estimated blood loss 5 cc Urine output: NA, voided before moving to OR Complications none  Condition stable  Disposition PACU  Specimen: Endometrial polyp with endometrial curettings   Procedure  Indication:Postmenopausal bleeding. Endometrial polyp per office sono.Patient was counseled on risks/ complications including infection, bleeding, damage to internal organs, she understood and agrees, gave informed written consent.  Patient was brought to the operating room with IV running. Time out was carried out. She received preop 2 gm Ancef. She underwent general anesthesia via LMA without complications. She was given dorsolithotomy position. Parts were prepped and draped in standard fashion. Bimanual exam revealed uterus to be anteverted and normal size. Speculum was placed and cervix was grasped with single-tooth tenaculum. Cervical block with 10 cc 1%Xylocaine with epinephrine given. The uterus was sounded to 9 cm. Cervical os was dilated to 21 [place] dilator. Myosure Lite Hysteroscope was introduced in the uterine cavity under vision, using saline for irrigation.  Findings: one large and 2 small endometrial polyps noted in endometrial cavity. Tubal ostii normal Hysteroscopic polypectomy was performed with Myosure Lite under vision and then visual curettage done with same. Specimen sent to path. Hysteroscope was removed. Endometrial curettage was performed with curette but no tissue obtained.   Fluid deficit 400 cc.  All counts are correct x2. No complications. Patient brought to the recovery room in stable condition.  Patient will be discharged home today. Warning signs of infection and excessive bleeding reviewed.   Azucena Fallen, MD.

## 2021-08-23 NOTE — Anesthesia Postprocedure Evaluation (Signed)
Anesthesia Post Note  Patient: TAKITA RIECKE  Procedure(s) Performed: DILATATION & CURETTAGE/HYSTEROSCOPY WITH MYOSURE (Vagina )     Patient location during evaluation: PACU Anesthesia Type: General Level of consciousness: awake Pain management: pain level controlled Vital Signs Assessment: post-procedure vital signs reviewed and stable Respiratory status: spontaneous breathing and respiratory function stable Cardiovascular status: stable Postop Assessment: no apparent nausea or vomiting Anesthetic complications: no   No notable events documented.  Last Vitals:  Vitals:   08/23/21 1445 08/23/21 1450  BP: (!) 158/75 (!) 174/79  Pulse: 70 77  Resp: 11 20  Temp:    SpO2: 95% 100%    Last Pain:  Vitals:   08/23/21 1450  TempSrc:   PainSc: 0-No pain                 Merlinda Frederick

## 2021-08-23 NOTE — H&P (Signed)
Victoria Beasley is an 66 y.o. female w/ postmenopausal bleeding and office sono noting endometrial polyp, she is here for hysteroscopic polypectomy w/ myosure and D&C.  Nl recent Pap. Prior vag bleeding and h/scopy, D&C in 2014. No HRT.  No breast problems  No LMP recorded. Patient is postmenopausal.    Past Medical History:  Diagnosis Date   Anemia    Arthritis    Benign neoplasm of other and unspecified site of the digestive system    Depression    Depressive disorder, not elsewhere classified    Diverticulosis of colon (without mention of hemorrhage)    Dysrhythmia    palpitations   Endometrial thickening on ultra sound 01/04/2013   Esophageal stricture    Gallstones    Gallstones    GERD (gastroesophageal reflux disease)    Heart palpitations    HTN (hypertension)    Hypertension    Menorrhagia 01/04/2013   Morton's neuroma of right foot    Obesity    Obesity    PONV (postoperative nausea and vomiting)    Stricture and stenosis of esophagus     Past Surgical History:  Procedure Laterality Date   CESAREAN SECTION     x 2   CHOLECYSTECTOMY  09/06/2011   Procedure: LAPAROSCOPIC CHOLECYSTECTOMY;  Surgeon: Rolm Bookbinder, MD;  Location: [facility];  Service: General;  Laterality: N/A;   COLONOSCOPY     DILATATION & CURRETTAGE/HYSTEROSCOPY WITH RESECTOCOPE N/A 01/04/2013   Procedure: DILATATION & CURETTAGE Diagnostic HYSTEROSCOPY  with resectoscope ;  Surgeon: Elveria Royals, MD;  Location: [facility];  Service: Gynecology;  Laterality: N/A;   KNEE SURGERY     left   UPPER GASTROINTESTINAL ENDOSCOPY     vericose vein laser     x 3    Family History  Problem Relation Age of Onset   Diabetes Mother    Hypertension Father    Breast cancer Sister 30   Diabetes Sister    Esophageal cancer Cousin 9   Colon cancer Neg Hx    Rectal cancer Neg Hx    Stomach cancer Neg Hx     Social History:  reports that she has never smoked. She has never used smokeless tobacco. She  reports current alcohol use of about 3.0 standard drinks of alcohol per week. She reports that she does not use drugs.  Allergies: No Known Allergies  Medications Prior to Admission  Medication Sig Dispense Refill Last Dose   celecoxib (CELEBREX) 200 MG capsule Take 200 mg by mouth daily.   08/22/2021   cholecalciferol (VITAMIN D) 1000 UNITS tablet Take 1,000 Units by mouth daily.    08/22/2021   fluticasone (FLONASE) 50 MCG/ACT nasal spray Place into both nostrils daily.   08/23/2021   hydrochlorothiazide (,MICROZIDE/HYDRODIURIL,) 12.5 MG capsule Take 12.5 mg by mouth daily.     08/22/2021   loratadine (CLARITIN) 10 MG tablet Take 10 mg by mouth daily.   08/22/2021   Multiple Vitamin (MULTIVITAMIN PO) Take 1 tablet by mouth daily.    Past Week   NON FORMULARY Omega Joint Extra 3 capsules daily    Past Week   omeprazole (PRILOSEC) 20 MG capsule Take 20 mg by mouth daily.   08/22/2021   Wheat Dextrin (BENEFIBER) POWD Take 1 heaping tablespoon by mouth in liquid daily to bulk stool  0 Past Week   COVID-19 mRNA bivalent vaccine, Pfizer, (PFIZER COVID-19 VAC BIVALENT) injection Inject into the muscle. 0.3 mL 0    COVID-19 mRNA Vac-TriS,  Pfizer, (PFIZER-BIONT COVID-19 VAC-TRIS) SUSP injection Inject into the muscle. 0.3 mL 0    esomeprazole (NEXIUM) 40 MG capsule Take 1 capsule (40 mg total) by mouth daily. (Patient taking differently: 40 mg daily as needed. ) 90 capsule 4     Review of Systems neg for SOB/ CP/HA  Blood pressure (!) 189/97, pulse 79, temperature 98.6 F (37 C), temperature source Oral, resp. rate 18, height '5\' 5"'$  (1.651 m), weight 125.6 kg, SpO2 98 %. Physical Exam Physical exam:  A&O x 3, no acute distress. Pleasant HEENT neg, no thyromegaly Lungs CTA bilat CV RRR, S1S2 normal Abdo soft, non tender, non acute Extr no edema/ tenderness Pelvic Cx stenotic, uterus AV, small no adnexal mass  Results for orders placed or performed during the hospital encounter of 08/23/21 (from the  past 24 hour(s))  CBC     Status: None   Collection Time: 08/23/21 12:00 PM  Result Value Ref Range   WBC 8.9 4.0 - 10.5 K/uL   RBC 4.19 3.87 - 5.11 MIL/uL   Hemoglobin 12.7 12.0 - 15.0 g/dL   HCT 38.4 36.0 - 46.0 %   MCV 91.6 80.0 - 100.0 fL   MCH 30.3 26.0 - 34.0 pg   MCHC 33.1 30.0 - 36.0 g/dL   RDW 14.5 11.5 - 15.5 %   Platelets 240 150 - 400 K/uL   nRBC 0.0 0.0 - 0.2 %  Basic metabolic panel     Status: None   Collection Time: 08/23/21 12:00 PM  Result Value Ref Range   Sodium 137 135 - 145 mmol/L   Potassium 4.1 3.5 - 5.1 mmol/L   Chloride 105 98 - 111 mmol/L   CO2 24 22 - 32 mmol/L   Glucose, Bld 92 70 - 99 mg/dL   BUN 14 8 - 23 mg/dL   Creatinine, Ser 0.68 0.44 - 1.00 mg/dL   Calcium 9.1 8.9 - 10.3 mg/dL   GFR, Estimated >60 >60 mL/min   Anion gap 8 5 - 15    No results found.  Assessment/Plan: 66 yo w/ endometrial polyp and vaginal bleeding. Here for D&C, hysteroscopic polypectomy w/ myosure Risks/complications of surgery reviewed incl infection, bleeding, damage to internal organs including bladder, bowels, ureters, blood vessels, other risks from anesthesia, VTE and delayed complications of any surgery, complications in future surgery reviewed.   Elveria Royals 08/23/2021, 1:37 PM

## 2021-08-23 NOTE — Anesthesia Preprocedure Evaluation (Addendum)
Anesthesia Evaluation  Patient identified by MRN, date of birth, ID band Patient awake    Reviewed: Allergy & Precautions, NPO status , Patient's Chart, lab work & pertinent test results  History of Anesthesia Complications (+) PONV and history of anesthetic complications  Airway Mallampati: I  TM Distance: >3 FB Neck ROM: Full    Dental no notable dental hx.    Pulmonary neg pulmonary ROS,    Pulmonary exam normal breath sounds clear to auscultation       Cardiovascular hypertension, Pt. on medications Normal cardiovascular exam+ dysrhythmias  Rhythm:Regular Rate:Normal     Neuro/Psych PSYCHIATRIC DISORDERS Depression negative neurological ROS     GI/Hepatic Neg liver ROS, GERD  Medicated,  Endo/Other  Morbid obesity  Renal/GU negative Renal ROS  negative genitourinary   Musculoskeletal  (+) Arthritis , Osteoarthritis,    Abdominal   Peds negative pediatric ROS (+)  Hematology  (+) Blood dyscrasia, anemia ,   Anesthesia Other Findings   Reproductive/Obstetrics menorrhagia                            Anesthesia Physical Anesthesia Plan  ASA: 3  Anesthesia Plan: General   Post-op Pain Management: Tylenol PO (pre-op)*   Induction: Intravenous  PONV Risk Score and Plan: 4 or greater and Treatment may vary due to age or medical condition, Ondansetron, Dexamethasone, Midazolam and Scopolamine patch - Pre-op  Airway Management Planned: LMA  Additional Equipment: None  Intra-op Plan:   Post-operative Plan: Extubation in OR  Informed Consent: I have reviewed the patients History and Physical, chart, labs and discussed the procedure including the risks, benefits and alternatives for the proposed anesthesia with the patient or authorized representative who has indicated his/her understanding and acceptance.     Dental advisory given  Plan Discussed with: CRNA, Anesthesiologist  and Surgeon  Anesthesia Plan Comments:         Anesthesia Quick Evaluation

## 2021-08-23 NOTE — Anesthesia Procedure Notes (Signed)
Procedure Name: LMA Insertion Date/Time: 08/23/2021 1:50 PM  Performed by: Clearnce Sorrel, CRNAPre-anesthesia Checklist: Patient identified, Emergency Drugs available, Suction available and Patient being monitored Patient Re-evaluated:Patient Re-evaluated prior to induction Oxygen Delivery Method: Circle System Utilized Preoxygenation: Pre-oxygenation with 100% oxygen Induction Type: IV induction Ventilation: Mask ventilation without difficulty LMA: LMA inserted LMA Size: 4.0 Number of attempts: 1 Airway Equipment and Method: Bite block Placement Confirmation: positive ETCO2 Tube secured with: Tape Dental Injury: Teeth and Oropharynx as per pre-operative assessment

## 2021-08-23 NOTE — Discharge Instructions (Addendum)
  Post Anesthesia Home Care Instructions  Activity: Get plenty of rest for the remainder of the day. A responsible individual must stay with you for 24 hours following the procedure.  For the next 24 hours, DO NOT: -Drive a car -Paediatric nurse -Drink alcoholic beverages -Take any medication unless instructed by your physician -Make any legal decisions or sign important papers.  Meals: Start with liquid foods such as gelatin or soup. Progress to regular foods as tolerated. Avoid greasy, spicy, heavy foods. If nausea and/or vomiting occur, drink only clear liquids until the nausea and/or vomiting subsides. Call your physician if vomiting continues.  Special Instructions/Symptoms: Your throat may feel dry or sore from the anesthesia or the breathing tube placed in your throat during surgery. If this causes discomfort, gargle with warm salt water. The discomfort should disappear within 24 hours.  If you had a scopolamine patch placed behind your ear for the management of post- operative nausea and/or vomiting:  1. The medication in the patch is effective for 72 hours, after which it should be removed.  Wrap patch in a tissue and discard in the trash. Wash hands thoroughly with soap and water. 2. You may remove the patch earlier than 72 hours if you experience unpleasant side effects which may include dry mouth, dizziness or visual disturbances. 3. Avoid touching the patch. Wash your hands with soap and water after contact with the patch.    No acetaminophen/Tylenol until after 6:15 pm today if needed. No ibuprofen, Advil, Aleve, Motrin, ketorolac, meloxicam, celebrex, naproxen, or other NSAIDS until after 8:15 pm today if needed.    D & C Home care Instructions:   Personal hygiene:  Used sanitary napkins for vaginal drainage not tampons. Shower or tub bathe the day after your procedure. No douching until bleeding stops. Always wipe from front to back after  Elimination.  Activity: Do  not drive or operate any equipment today. The effects of the anesthesia are still present and drowsiness may result. Rest today, not necessarily flat bed rest, just take it easy. You may resume your normal activity in one to 2 days.  Sexual activity: No intercourse for one week or as indicated by your physician  Diet: Eat a light diet as desired this evening. You may resume a regular diet tomorrow.  Return to work: One to 2 days.  General Expectations of your surgery: Vaginal bleeding should be no heavier than a normal period. Spotting may continue up to 10 days. Mild cramps may continue for a couple of days. You may have a regular period in 2-6 weeks.  Unexpected observations call your doctor if these occur: persistent or heavy bleeding. Severe abdominal cramping or pain. Elevation of temperature greater than 100F. Inability to urinate.  Call for a follow-up appointment.

## 2021-08-24 ENCOUNTER — Encounter (HOSPITAL_BASED_OUTPATIENT_CLINIC_OR_DEPARTMENT_OTHER): Payer: Self-pay | Admitting: Obstetrics & Gynecology

## 2021-08-24 LAB — SURGICAL PATHOLOGY

## 2021-09-03 ENCOUNTER — Ambulatory Visit
Admission: RE | Admit: 2021-09-03 | Discharge: 2021-09-03 | Disposition: A | Discharge status: Home / Self Care | Payer: Medicare Other | Source: Ambulatory Visit | Attending: Family Medicine | Admitting: Family Medicine

## 2021-09-03 DIAGNOSIS — Z1231 Encounter for screening mammogram for malignant neoplasm of breast: Secondary | ICD-10-CM

## 2022-05-25 ENCOUNTER — Other Ambulatory Visit: Payer: Self-pay | Admitting: Internal Medicine

## 2022-05-25 DIAGNOSIS — Z13820 Encounter for screening for osteoporosis: Secondary | ICD-10-CM

## 2022-07-14 ENCOUNTER — Encounter: Payer: Self-pay | Admitting: Gastroenterology

## 2022-08-16 ENCOUNTER — Other Ambulatory Visit: Payer: Self-pay | Admitting: Internal Medicine

## 2022-08-16 DIAGNOSIS — Z1231 Encounter for screening mammogram for malignant neoplasm of breast: Secondary | ICD-10-CM

## 2022-09-13 ENCOUNTER — Ambulatory Visit
Admission: RE | Admit: 2022-09-13 | Discharge: 2022-09-13 | Disposition: A | Discharge status: Home / Self Care | Payer: Medicare Other | Source: Ambulatory Visit | Attending: Internal Medicine | Admitting: Internal Medicine

## 2022-09-13 DIAGNOSIS — Z1231 Encounter for screening mammogram for malignant neoplasm of breast: Secondary | ICD-10-CM

## 2022-09-22 ENCOUNTER — Ambulatory Visit (AMBULATORY_SURGERY_CENTER): Payer: Medicare Other

## 2022-09-22 ENCOUNTER — Encounter: Payer: Self-pay | Admitting: Gastroenterology

## 2022-09-22 VITALS — Ht 64.5 in | Wt 253.0 lb

## 2022-09-22 DIAGNOSIS — Z86010 Personal history of colonic polyps: Secondary | ICD-10-CM

## 2022-09-22 MED ORDER — NA SULFATE-K SULFATE-MG SULF 17.5-3.13-1.6 GM/177ML PO SOLN: 1.0000 | Freq: Once | ORAL | 0 refills | Status: AC

## 2022-09-22 NOTE — Progress Notes (Signed)

## 2022-10-13 ENCOUNTER — Ambulatory Visit (AMBULATORY_SURGERY_CENTER): Payer: Medicare Other | Admitting: Gastroenterology

## 2022-10-13 ENCOUNTER — Encounter: Payer: Self-pay | Admitting: Gastroenterology

## 2022-10-13 VITALS — BP 158/81 | HR 68 | Temp 97.5°F | Resp 14 | Ht 64.5 in | Wt 253.0 lb

## 2022-10-13 DIAGNOSIS — Z09 Encounter for follow-up examination after completed treatment for conditions other than malignant neoplasm: Secondary | ICD-10-CM | POA: Diagnosis not present

## 2022-10-13 DIAGNOSIS — Z86010 Personal history of colonic polyps: Secondary | ICD-10-CM | POA: Diagnosis not present

## 2022-10-13 MED ORDER — SODIUM CHLORIDE 0.9 % IV SOLN: 500.0000 mL | Freq: Once | INTRAVENOUS | Status: DC

## 2022-10-13 NOTE — Patient Instructions (Signed)
-  Handout on diverticulosis and hemorrhoids provided -repeat colonoscopy in5 years for surveillance recommended.  -Continue present medications   YOU HAD AN ENDOSCOPIC PROCEDURE TODAY AT THE [facility]:   Refer to the procedure report that was given to you for any specific questions about what was found during the examination.  If the procedure report does not answer your questions, please call your gastroenterologist to clarify.  If you requested that your care partner not be given the details of your procedure findings, then the procedure report has been included in a sealed envelope for you to review at your convenience later.  YOU SHOULD EXPECT: Some feelings of bloating in the abdomen. Passage of more gas than usual.  Walking can help get rid of the air that was put into your GI tract during the procedure and reduce the bloating. If you had a lower endoscopy (such as a colonoscopy or flexible sigmoidoscopy) you may notice spotting of blood in your stool or on the toilet paper. If you underwent a bowel prep for your procedure, you may not have a normal bowel movement for a few days.  Please Note:  You might notice some irritation and congestion in your nose or some drainage.  This is from the oxygen used during your procedure.  There is no need for concern and it should clear up in a day or so.  SYMPTOMS TO REPORT IMMEDIATELY:  Following lower endoscopy (colonoscopy or flexible sigmoidoscopy):  Excessive amounts of blood in the stool  Significant tenderness or worsening of abdominal pains  Swelling of the abdomen that is new, acute  Fever of 100F or higher   For urgent or emergent issues, a gastroenterologist can be reached at any hour by calling [phone]. Do not use MyChart messaging for urgent concerns.    DIET:  We do recommend a small meal at first, but then you may proceed to your regular diet.  Drink plenty of fluids but you should avoid alcoholic beverages for 24  hours.  ACTIVITY:  You should plan to take it easy for the rest of today and you should NOT DRIVE or use heavy machinery until tomorrow (because of the sedation medicines used during the test).    FOLLOW UP: Our staff will call the number listed on your records the next business day following your procedure.  We will call around 7:15- 8:00 am to check on you and address any questions or concerns that you may have regarding the information given to you following your procedure. If we do not reach you, we will leave a message.     If any biopsies were taken you will be contacted by phone or by letter within the next 1-3 weeks.  Please call us at [phone] if you have not heard about the biopsies in 3 weeks.    SIGNATURES/CONFIDENTIALITY: You and/or your care partner have signed paperwork which will be entered into your electronic medical record.  These signatures attest to the fact that that the information above on your After Visit Summary has been reviewed and is understood.  Full responsibility of the confidentiality of this discharge information lies with you and/or your care-partner.

## 2022-10-13 NOTE — Progress Notes (Signed)
Pt's states no medical or surgical changes since previsit or office visit. 

## 2022-10-13 NOTE — Progress Notes (Signed)
Vss nad trans to pacu 

## 2022-10-13 NOTE — Op Note (Signed)
[facility] Patient Name: Victoria Beasley Procedure Date: 10/13/2022 8:00 AM MRN: [id number] Endoscopist: Napoleon Form , MD, [id number] Age: 67 Referring MD:  Date of Birth: 1955-03-07 Gender: Female Account #: [id number] Procedure:                Colonoscopy Indications:              High risk colon cancer surveillance: Personal                            history of colonic polyps, High risk colon cancer                            surveillance: Personal history of adenoma (10 mm or                            greater in size), High risk colon cancer                            surveillance: Personal history of multiple (3 or                            more) adenomas Medicines:                Monitored Anesthesia Care Procedure:                Pre-Anesthesia Assessment:                           - Prior to the procedure, a History and Physical                            was performed, and patient medications and                            allergies were reviewed. The patient's tolerance of                            previous anesthesia was also reviewed. The risks                            and benefits of the procedure and the sedation                            options and risks were discussed with the patient.                            All questions were answered, and informed consent                            was obtained. Prior Anticoagulants: The patient has                            taken no anticoagulant or antiplatelet agents. ASA  Grade Assessment: II - A patient with mild systemic                            disease. After reviewing the risks and benefits,                            the patient was deemed in satisfactory condition to                            undergo the procedure.                           After obtaining informed consent, the colonoscope                            was passed under direct vision. Throughout the                             procedure, the patient's blood pressure, pulse, and                            oxygen saturations were monitored continuously. The                            Olympus Scope [id number] was introduced through the                            anus and advanced to the the cecum, identified by                            appendiceal orifice and ileocecal valve. The                            colonoscopy was performed without difficulty. The                            patient tolerated the procedure well. The quality                            of the bowel preparation was good. The ileocecal                            valve, appendiceal orifice, and rectum were                            photographed. Scope In: 8:15:38 AM Scope Out: 8:37:05 AM Scope Withdrawal Time: 0 hours 15 minutes 31 seconds  Total Procedure Duration: 0 hours 21 minutes 27 seconds  Findings:                 The perianal and digital rectal examinations were                            normal.  Scattered small-mouthed diverticula were found in                            the sigmoid colon, descending colon, transverse                            colon and ascending colon.                           Non-bleeding external and internal hemorrhoids were                            found during retroflexion. The hemorrhoids were                            small. Complications:            No immediate complications. Estimated Blood Loss:     Estimated blood loss was minimal. Impression:               - Diverticulosis in the sigmoid colon, in the                            descending colon, in the transverse colon and in                            the ascending colon.                           - Non-bleeding external and internal hemorrhoids.                           - No specimens collected. Recommendation:           - Patient has a contact number available for                             emergencies. The signs and symptoms of potential                            delayed complications were discussed with the                            patient. Return to normal activities tomorrow.                            Written discharge instructions were provided to the                            patient.                           - Resume previous diet.                           - Continue present medications.                           -  Repeat colonoscopy in 5 years for surveillance. Napoleon Form, MD 10/13/2022 8:44:10 AM This report has been signed electronically.

## 2022-10-13 NOTE — Progress Notes (Signed)
[facility] History and Physical   Primary Care Physician:  Ollen Bowl, MD   Reason for Procedure:  History of adenomatous colon polyps  Plan:    Surveillance colonoscopy with possible interventions as needed     HPI: Victoria Beasley is a very pleasant 67 y.o. female here for surveillance colonoscopy. Denies any nausea, vomiting, abdominal pain, melena or bright red blood per rectum  The risks and benefits as well as alternatives of endoscopic procedure(s) have been discussed and reviewed. All questions answered. The patient agrees to proceed.    Past Medical History:  Diagnosis Date   Anemia    Arthritis    Benign neoplasm of other and unspecified site of the digestive system    Depression    Depressive disorder, not elsewhere classified    Diverticulosis of colon (without mention of hemorrhage)    Dysrhythmia    palpitations   Endometrial polyp 08/23/2021   Endometrial thickening on ultra sound 01/04/2013   Esophageal stricture    Gallstones    Gallstones    GERD (gastroesophageal reflux disease)    Heart palpitations    HTN (hypertension)    Hypertension    Menorrhagia 01/04/2013   Morton's neuroma of right foot    Obesity    Obesity    PONV (postoperative nausea and vomiting)    Stricture and stenosis of esophagus     Past Surgical History:  Procedure Laterality Date   CESAREAN SECTION     x 2   CHOLECYSTECTOMY  09/06/2011   Procedure: LAPAROSCOPIC CHOLECYSTECTOMY;  Surgeon: Emelia Loron, MD;  Location: [facility];  Service: General;  Laterality: N/A;   COLONOSCOPY     DILATATION & CURETTAGE/HYSTEROSCOPY WITH MYOSURE N/A 08/23/2021   Procedure: DILATATION & CURETTAGE/HYSTEROSCOPY WITH MYOSURE;  Surgeon: Shea Evans, MD;  Location: [facility];  Service: Gynecology;  Laterality: N/A;   DILATATION & CURRETTAGE/HYSTEROSCOPY WITH RESECTOCOPE N/A 01/04/2013   Procedure: DILATATION & CURETTAGE Diagnostic HYSTEROSCOPY  with  resectoscope ;  Surgeon: Robley Fries, MD;  Location: [facility];  Service: Gynecology;  Laterality: N/A;   KNEE SURGERY     left   UPPER GASTROINTESTINAL ENDOSCOPY     vericose vein laser     x 3    Prior to Admission medications   Medication Sig Start Date End Date Taking? Authorizing Provider  celecoxib (CELEBREX) 200 MG capsule Take 200 mg by mouth daily.   Yes [provider]  cholecalciferol (VITAMIN D) 1000 UNITS tablet Take 1,000 Units by mouth daily.    Yes [provider]  fluticasone (FLONASE) 50 MCG/ACT nasal spray Place into both nostrils daily.   Yes [provider]  hydrochlorothiazide (,MICROZIDE/HYDRODIURIL,) 12.5 MG capsule Take 12.5 mg by mouth daily.     Yes [provider]  loratadine (CLARITIN) 10 MG tablet Take 10 mg by mouth daily.   Yes [provider]  Multiple Vitamin (MULTIVITAMIN PO) Take 1 tablet by mouth daily.    Yes [provider]  omeprazole (PRILOSEC) 20 MG capsule Take 20 mg by mouth daily. 08/09/16  Yes [provider]  COVID-19 mRNA bivalent vaccine, Pfizer, (PFIZER COVID-19 VAC BIVALENT) injection Inject into the muscle. Patient not taking: Reported on 09/22/2022 11/30/20   Judyann Munson, MD  COVID-19 mRNA Vac-TriS, Pfizer, (PFIZER-BIONT COVID-19 VAC-TRIS) SUSP injection Inject into the muscle. Patient not taking: Reported on 09/22/2022 05/29/20   Judyann Munson, MD  diclofenac Sodium (VOLTAREN) 1 % GEL APPLY 2 GRAM TO THE  AFFECTED AREA(S) BY TOPICAL ROUTE 4 TIMES PER DAY    [provider]  esomeprazole (NEXIUM) 40 MG capsule Take 1 capsule (40 mg total) by mouth daily. Patient taking differently: 40 mg daily as needed. 09/14/10 06/05/19  Louis Meckel, MD  NON FORMULARY Omega Joint Extra 3 capsules daily     [provider]  OZEMPIC, 1 MG/DOSE, 4 MG/3ML SOPN INJECT 1 MG INTO THE SKIN EVERY 7 DAYS 07/06/22   [provider]  Wheat Dextrin (BENEFIBER) POWD Take 1  heaping tablespoon by mouth in liquid daily to bulk stool Patient not taking: Reported on 10/13/2022 01/21/14   Hvozdovic, Tollie Pizza, PA-C    Current Outpatient Medications  Medication Sig Dispense Refill   celecoxib (CELEBREX) 200 MG capsule Take 200 mg by mouth daily.     cholecalciferol (VITAMIN D) 1000 UNITS tablet Take 1,000 Units by mouth daily.      fluticasone (FLONASE) 50 MCG/ACT nasal spray Place into both nostrils daily.     hydrochlorothiazide (,MICROZIDE/HYDRODIURIL,) 12.5 MG capsule Take 12.5 mg by mouth daily.       loratadine (CLARITIN) 10 MG tablet Take 10 mg by mouth daily.     Multiple Vitamin (MULTIVITAMIN PO) Take 1 tablet by mouth daily.      omeprazole (PRILOSEC) 20 MG capsule Take 20 mg by mouth daily.     COVID-19 mRNA bivalent vaccine, Pfizer, (PFIZER COVID-19 VAC BIVALENT) injection Inject into the muscle. (Patient not taking: Reported on 09/22/2022) 0.3 mL 0   COVID-19 mRNA Vac-TriS, Pfizer, (PFIZER-BIONT COVID-19 VAC-TRIS) SUSP injection Inject into the muscle. (Patient not taking: Reported on 09/22/2022) 0.3 mL 0   diclofenac Sodium (VOLTAREN) 1 % GEL APPLY 2 GRAM TO THE AFFECTED AREA(S) BY TOPICAL ROUTE 4 TIMES PER DAY     esomeprazole (NEXIUM) 40 MG capsule Take 1 capsule (40 mg total) by mouth daily. (Patient taking differently: 40 mg daily as needed.) 90 capsule 4   NON FORMULARY Omega Joint Extra 3 capsules daily      OZEMPIC, 1 MG/DOSE, 4 MG/3ML SOPN INJECT 1 MG INTO THE SKIN EVERY 7 DAYS     Wheat Dextrin (BENEFIBER) POWD Take 1 heaping tablespoon by mouth in liquid daily to bulk stool (Patient not taking: Reported on 10/13/2022)  0   Current Facility-Administered Medications  Medication Dose Route Frequency Provider Last Rate Last Admin   0.9 %  sodium chloride infusion  500 mL Intravenous Once Napoleon Form, MD        Allergies as of 10/13/2022   (No Known Allergies)    Family History  Problem Relation Age of Onset   Diabetes Mother    Hypertension  Father    Breast cancer Sister 66   Diabetes Sister    Esophageal cancer Cousin 41   Colon cancer Neg Hx    Rectal cancer Neg Hx    Stomach cancer Neg Hx    Colon polyps Neg Hx     Social History   Socioeconomic History   Marital status: Divorced    Spouse name: Not on file   Number of children: 2   Years of education: Not on file   Highest education level: Not on file  Occupational History   Occupation: NP    Employer: [employer]  Tobacco Use   Smoking status: Never   Smokeless tobacco: Never  Vaping Use   Vaping status: Never Used  Substance and Sexual Activity   Alcohol use: Yes    Alcohol/week:  3.0 standard drinks of alcohol    Types: 3 Glasses of wine per week    Comment: socially   Drug use: No   Sexual activity: Not Currently  Other Topics Concern   Not on file  Social History Narrative   Not on file   Social Determinants of Health   Financial Resource Strain: Not on file  Food Insecurity: Not on file  Transportation Needs: Not on file  Physical Activity: Not on file  Stress: Not on file  Social Connections: Not on file  Intimate Partner Violence: Not on file    Review of Systems:  All other review of systems negative except as mentioned in the HPI.  Physical Exam: Vital signs in last 24 hours: BP 137/84   Pulse 72   Temp (!) 97.5 F (36.4 C)   Resp 14   Ht 5' 4.5" (1.638 m)   Wt 253 lb (114.8 kg)   SpO2 100%   BMI 42.76 kg/m  General:   Alert, NAD Lungs:  Clear .   Heart:  Regular rate and rhythm Abdomen:  Soft, nontender and nondistended. Neuro/Psych:  Alert and cooperative. Normal mood and affect. A and O x 3  Reviewed labs, radiology imaging, old records and pertinent past GI work up  Patient is appropriate for planned procedure(s) and anesthesia in an ambulatory setting   K. Scherry Ran , MD [phone]

## 2022-10-14 ENCOUNTER — Telehealth: Payer: Self-pay

## 2022-10-14 NOTE — Telephone Encounter (Signed)
  Follow up Call-     10/13/2022    7:17 AM  Call back number  Post procedure Call Back phone  # [phone]  Permission to leave phone message Yes     Patient questions:  Do you have a fever, pain , or abdominal swelling? No. Pain Score  0 *  Have you tolerated food without any problems? Yes.    Have you been able to return to your normal activities? Yes.    Do you have any questions about your discharge instructions: Diet   No. Medications  No. Follow up visit  No.  Do you have questions or concerns about your Care? No.  Actions: * If pain score is 4 or above: No action needed, pain <4.

## 2022-12-13 ENCOUNTER — Ambulatory Visit
Admission: RE | Admit: 2022-12-13 | Discharge: 2022-12-13 | Disposition: A | Discharge status: Home / Self Care | Payer: Medicare Other | Source: Ambulatory Visit | Attending: Internal Medicine | Admitting: Internal Medicine

## 2022-12-13 DIAGNOSIS — Z13820 Encounter for screening for osteoporosis: Secondary | ICD-10-CM

## 2023-07-31 ENCOUNTER — Ambulatory Visit (INDEPENDENT_AMBULATORY_CARE_PROVIDER_SITE_OTHER): Admitting: Podiatry

## 2023-07-31 ENCOUNTER — Encounter: Payer: Self-pay | Admitting: Podiatry

## 2023-07-31 ENCOUNTER — Ambulatory Visit (INDEPENDENT_AMBULATORY_CARE_PROVIDER_SITE_OTHER)

## 2023-07-31 DIAGNOSIS — M79671 Pain in right foot: Secondary | ICD-10-CM

## 2023-07-31 DIAGNOSIS — M21622 Bunionette of left foot: Secondary | ICD-10-CM

## 2023-07-31 DIAGNOSIS — M7752 Other enthesopathy of left foot: Secondary | ICD-10-CM

## 2023-07-31 DIAGNOSIS — M21621 Bunionette of right foot: Secondary | ICD-10-CM

## 2023-07-31 DIAGNOSIS — M79672 Pain in left foot: Secondary | ICD-10-CM

## 2023-07-31 MED ORDER — TRIAMCINOLONE ACETONIDE 10 MG/ML IJ SUSP
10.0000 mg | Freq: Once | INTRAMUSCULAR | Status: AC
  Administered 2023-07-31: 10 mg

## 2023-07-31 NOTE — Progress Notes (Signed)
 Complains of pain around the fifth metatarsal phalangeal joint.  Tailor's bunions bilaterally.  Has been bothering her for several months and getting worse.  He wears walking shoes or Keen sandals.  Has not noticed any redness or ecchymosis.  Does not believe he   Physical exam:  General appearance: Pleasant, and in no acute distress. AOx3.  Vascular: Pedal pulses: DP 2/4 bilaterally, PT 2/4 bilaterally. Mild edema lower legs bilaterally. Capillary fill time immediate.  Neurological: Light touch intact feet bilaterally.   No clonus or spasticity noted.   Dermatologic:   Skin normal temperature bilaterally.  Skin normal color, tone, and texture bilaterally.   Musculoskeletal: Tailors bunion deformities bilaterally.  Tenderness at the fifth metatarsal phalangeal joint plantar laterally left greater than right.  Met adductus deformity bilaterally right greater than the left no tenderness with range of motion fifth metatarsal phalangeal joint.  No tenderness fifth toe.  Radiographs: 3 views right foot: Tailor's bunion deformity with increased fourth fifth intermetatarsal angle.  Significant met adductus.  No evidence of any fractures or dislocations.  No bone tumors.  Good bone density.  3 views left foot: There was bunion deformity with severe increase in fourth fifth intermetatarsal angle.  Met adductus although not as severe as on the right.  No notes any fractures at this lesion no bone tumors noted good bone density.  Diagnosis    Plan: -New patient office visit level 3 evaluate for evaluation and management.  Modifier 25 -Discussed with tailor's bunion deformities and resulting bursa's of the fifth metatarsal phalangeal joints recommended injection on the left today.   Also debrided hyperkeratotic lesion.  Discussed proper shoes to wear.  Icing as needed.  Discussed possibility of surgery if it continues to bother her. - -injected 2cc 1:1 mixture 0.5 cc Marcaine :Kenolog 10mg /80ml at  plain bursa plantar lateral aspect.   Return 2 weeks f/u inj LT

## 2023-08-15 ENCOUNTER — Ambulatory Visit (INDEPENDENT_AMBULATORY_CARE_PROVIDER_SITE_OTHER): Admitting: Podiatry

## 2023-08-15 DIAGNOSIS — M7752 Other enthesopathy of left foot: Secondary | ICD-10-CM | POA: Diagnosis not present

## 2023-08-15 DIAGNOSIS — M7751 Other enthesopathy of right foot: Secondary | ICD-10-CM | POA: Diagnosis not present

## 2023-08-15 NOTE — Progress Notes (Signed)
 Patient follow-up injection first secondary to tailor's bunion deformity left foot.  Doing much better no pain a little bit of soreness on the right which she got the callus.   Physical exam:  General appearance: Pleasant, and in no acute distress. AOx3.  Vascular: Pedal pulses: DP 2/4 bilaterally, PT 2/4 bilaterally.  Moderate edema lower legs bilaterally. Capillary fill time immediate.  Neurological: Light touch intact feet bilaterally.  Normal Achilles reflex bilaterally.  N  Dermatologic:   Skin normal temperature bilaterally.  Skin normal color, tone, and texture bilaterally.   Musculoskeletal: Tailors bunion bilaterally.  Slight tenderness on the plantar medial aspect of the right fifth MTP.  No tenderness with range of motion fifth MTP bilaterally.    Diagnosis: 1.  Bursitis feet bilaterally  Plan: -Established office visit for evaluation and management. - Doing well after injection.  Still little bit of soreness recommend she icing and use the area as needed.  Discussed again good comfortable shoes with good cushioning.  Discussed with that we will see how long she can go without having any attention given to it.   Return as needed

## 2023-09-22 ENCOUNTER — Other Ambulatory Visit: Payer: Self-pay | Admitting: Internal Medicine

## 2023-09-22 DIAGNOSIS — Z1231 Encounter for screening mammogram for malignant neoplasm of breast: Secondary | ICD-10-CM

## 2023-10-11 ENCOUNTER — Ambulatory Visit
Admission: RE | Admit: 2023-10-11 | Discharge: 2023-10-11 | Disposition: A | Discharge status: Home / Self Care | Source: Ambulatory Visit | Attending: Internal Medicine | Admitting: Internal Medicine

## 2023-10-11 DIAGNOSIS — Z1231 Encounter for screening mammogram for malignant neoplasm of breast: Secondary | ICD-10-CM

## 2023-11-29 ENCOUNTER — Other Ambulatory Visit (HOSPITAL_BASED_OUTPATIENT_CLINIC_OR_DEPARTMENT_OTHER): Payer: Self-pay

## 2023-11-29 MED ORDER — COMIRNATY 30 MCG/0.3ML IM SUSY
0.3000 mL | PREFILLED_SYRINGE | Freq: Once | INTRAMUSCULAR | 0 refills | Status: AC
  Filled 2023-11-29: qty 0.3, 1d supply, fill #0

## 2023-11-29 MED ORDER — FLUZONE HIGH-DOSE 0.5 ML IM SUSY
0.5000 mL | PREFILLED_SYRINGE | Freq: Once | INTRAMUSCULAR | 0 refills | Status: AC
  Filled 2023-11-29: qty 0.5, 1d supply, fill #0
